# Patient Record
Sex: Female | Born: 1950
Health system: Southern US, Community
[De-identification: ages and names within clinical notes are randomized; demographics above are authoritative.]

## PROBLEM LIST (undated history)

## (undated) DIAGNOSIS — F419 Anxiety disorder, unspecified: Secondary | ICD-10-CM

## (undated) DIAGNOSIS — K219 Gastro-esophageal reflux disease without esophagitis: Secondary | ICD-10-CM

## (undated) DIAGNOSIS — T7840XA Allergy, unspecified, initial encounter: Secondary | ICD-10-CM

## (undated) DIAGNOSIS — M1990 Unspecified osteoarthritis, unspecified site: Secondary | ICD-10-CM

## (undated) HISTORY — DX: Anxiety disorder, unspecified: F41.9

## (undated) HISTORY — DX: Gastro-esophageal reflux disease without esophagitis: K21.9

## (undated) HISTORY — PX: UTERINE FIBROID SURGERY: SHX826

## (undated) HISTORY — PX: HERNIA REPAIR: SHX51

## (undated) HISTORY — DX: Unspecified osteoarthritis, unspecified site: M19.90

## (undated) HISTORY — DX: Allergy, unspecified, initial encounter: T78.40XA

## (undated) HISTORY — PX: ABDOMINAL HYSTERECTOMY: SHX81

---

## 2003-06-04 ENCOUNTER — Other Ambulatory Visit: Admission: RE | Admit: 2003-06-04 | Discharge: 2003-06-04 | Payer: Self-pay | Admitting: Internal Medicine

## 2004-04-12 ENCOUNTER — Ambulatory Visit: Payer: Self-pay | Admitting: Unknown Physician Specialty

## 2004-04-12 ENCOUNTER — Encounter: Payer: Self-pay | Admitting: Internal Medicine

## 2004-04-12 LAB — HM COLONOSCOPY

## 2005-08-08 ENCOUNTER — Ambulatory Visit: Payer: Self-pay | Admitting: Internal Medicine

## 2005-09-14 ENCOUNTER — Ambulatory Visit: Payer: Self-pay | Admitting: Internal Medicine

## 2005-09-14 ENCOUNTER — Other Ambulatory Visit: Admission: RE | Admit: 2005-09-14 | Discharge: 2005-09-14 | Payer: Self-pay | Admitting: Internal Medicine

## 2005-09-14 LAB — CONVERTED CEMR LAB: Pap Smear: NORMAL

## 2006-03-05 ENCOUNTER — Ambulatory Visit: Payer: Self-pay | Admitting: Internal Medicine

## 2006-03-12 ENCOUNTER — Encounter: Payer: Self-pay | Admitting: Internal Medicine

## 2006-08-08 ENCOUNTER — Encounter: Payer: Self-pay | Admitting: Internal Medicine

## 2007-05-21 ENCOUNTER — Ambulatory Visit: Payer: Self-pay | Admitting: Internal Medicine

## 2007-05-21 DIAGNOSIS — IMO0002 Reserved for concepts with insufficient information to code with codable children: Secondary | ICD-10-CM | POA: Insufficient documentation

## 2007-06-03 ENCOUNTER — Encounter: Payer: Self-pay | Admitting: Internal Medicine

## 2007-12-23 ENCOUNTER — Ambulatory Visit: Payer: Self-pay | Admitting: Internal Medicine

## 2007-12-23 DIAGNOSIS — R002 Palpitations: Secondary | ICD-10-CM

## 2008-08-16 ENCOUNTER — Telehealth: Payer: Self-pay | Admitting: Internal Medicine

## 2008-09-02 ENCOUNTER — Ambulatory Visit: Payer: Self-pay | Admitting: Family Medicine

## 2008-09-02 DIAGNOSIS — N644 Mastodynia: Secondary | ICD-10-CM

## 2008-09-02 DIAGNOSIS — M76899 Other specified enthesopathies of unspecified lower limb, excluding foot: Secondary | ICD-10-CM | POA: Insufficient documentation

## 2008-09-02 DIAGNOSIS — M25539 Pain in unspecified wrist: Secondary | ICD-10-CM | POA: Insufficient documentation

## 2008-09-13 ENCOUNTER — Encounter: Payer: Self-pay | Admitting: Family Medicine

## 2008-09-16 ENCOUNTER — Encounter (INDEPENDENT_AMBULATORY_CARE_PROVIDER_SITE_OTHER): Payer: Self-pay | Admitting: *Deleted

## 2009-10-17 ENCOUNTER — Ambulatory Visit: Payer: Self-pay | Admitting: Internal Medicine

## 2009-10-17 ENCOUNTER — Other Ambulatory Visit: Admission: RE | Admit: 2009-10-17 | Discharge: 2009-10-17 | Payer: Self-pay | Admitting: Internal Medicine

## 2009-10-17 DIAGNOSIS — D239 Other benign neoplasm of skin, unspecified: Secondary | ICD-10-CM | POA: Insufficient documentation

## 2009-10-17 DIAGNOSIS — R109 Unspecified abdominal pain: Secondary | ICD-10-CM | POA: Insufficient documentation

## 2009-10-17 LAB — HM PAP SMEAR

## 2009-10-19 ENCOUNTER — Encounter: Payer: Self-pay | Admitting: Internal Medicine

## 2009-10-19 LAB — CONVERTED CEMR LAB
ALT: 13 units/L (ref 0–35)
AST: 19 units/L (ref 0–37)
Albumin: 4.5 g/dL (ref 3.5–5.2)
Alkaline Phosphatase: 58 units/L (ref 39–117)
BUN: 15 mg/dL (ref 6–23)
CO2: 28 meq/L (ref 19–32)
Eosinophils Absolute: 0.1 10*3/uL (ref 0.0–0.7)
GFR calc non Af Amer: 90.86 mL/min (ref >60)
Glucose, Bld: 70 mg/dL (ref 70–99)
HCT: 39.3 % (ref 36.0–46.0)
Lymphocytes Relative: 30.4 % (ref 12.0–46.0)
Monocytes Absolute: 0.3 10*3/uL (ref 0.1–1.0)
Monocytes Relative: 5.5 % (ref 3.0–12.0)
Sodium: 146 meq/L — ABNORMAL HIGH (ref 135–145)
TSH: 1.71 microintl units/mL (ref 0.35–5.50)
Total Bilirubin: 1 mg/dL (ref 0.3–1.2)
Vitamin B-12: 212 pg/mL (ref 211–911)

## 2009-10-28 ENCOUNTER — Encounter: Payer: Self-pay | Admitting: Internal Medicine

## 2009-10-31 DIAGNOSIS — R928 Other abnormal and inconclusive findings on diagnostic imaging of breast: Secondary | ICD-10-CM | POA: Insufficient documentation

## 2009-11-01 ENCOUNTER — Telehealth: Payer: Self-pay | Admitting: Internal Medicine

## 2009-11-04 ENCOUNTER — Encounter: Admission: RE | Admit: 2009-11-04 | Discharge: 2009-11-04 | Payer: Self-pay | Admitting: Internal Medicine

## 2009-11-04 LAB — HM MAMMOGRAPHY

## 2009-11-22 ENCOUNTER — Ambulatory Visit: Payer: Self-pay | Admitting: Internal Medicine

## 2009-11-22 DIAGNOSIS — E559 Vitamin D deficiency, unspecified: Secondary | ICD-10-CM | POA: Insufficient documentation

## 2009-11-22 DIAGNOSIS — E538 Deficiency of other specified B group vitamins: Secondary | ICD-10-CM | POA: Insufficient documentation

## 2009-11-23 LAB — CONVERTED CEMR LAB
Vit D, 25-Hydroxy: 40 ng/mL (ref 30–89)
Vitamin B-12: 520 pg/mL (ref 211–911)

## 2010-06-04 LAB — CONVERTED CEMR LAB
BUN: 13 mg/dL (ref 6–23)
Basophils Absolute: 0 10*3/uL (ref 0.0–0.1)
CO2: 29 meq/L (ref 19–32)
Chloride: 105 meq/L (ref 96–112)
Eosinophils Absolute: 0.2 10*3/uL (ref 0.0–0.7)
Eosinophils Relative: 4.2 % (ref 0.0–5.0)
GFR calc Af Amer: 111 mL/min
GFR calc non Af Amer: 92 mL/min
Glucose, Bld: 80 mg/dL (ref 70–99)
LDL Cholesterol: 73 mg/dL (ref 0–99)
Neutrophils Relative %: 57.2 % (ref 43.0–77.0)
Phosphorus: 3.7 mg/dL (ref 2.3–4.6)
TSH: 1.2 microintl units/mL (ref 0.35–5.50)
Total CHOL/HDL Ratio: 3.2
Total Protein: 6.6 g/dL (ref 6.0–8.3)
Triglycerides: 52 mg/dL (ref 0–149)
VLDL: 10 mg/dL (ref 0–40)

## 2010-06-06 NOTE — Assessment & Plan Note (Signed)
Summary: CPX/CLE   Vital Signs:  Patient profile:   60 year old female Height:      66 inches Weight:      121 pounds BMI:     19.60 Temp:     98.3 degrees F oral Pulse rate:   60 / minute Pulse rhythm:   regular BP sitting:   118 / 78  (left arm) Cuff size:   regular  Vitals Entered By: Mervin Hack CMA Duncan Dull) (October 17, 2009 11:40 AM) CC: adult physical   History of Present Illness: Doing well  Has had a residual "motor running in my stomach" that goes back to hyper spells that woke her up years ago No recurrence of the spells but still abnormal sensation "trembling, almost like a cat purring"--- very intermittent. Occ affects her sleep No problems with appetite  no nausea or vomting  Also notes that she gets "electric type sensation" down both legs strange sensation without clear precipitating reasons lasts for a few seconds and then resolves  Still wtih abnormal sensation in left breast goes back a year or more not really painful Gets an "umph" if she burps --in the left breast area  Occ "spasm" when she swallows More with liquid Feels like it "catches"---no dysphagia otherwise just watches and it resolves     Allergies: No Known Drug Allergies  Past History:  Past Surgical History: Last updated: 06/03/2007 Vag delivery x 5 Fibroid procedure- D & C (2001) Hysterectomy (11/2003)  Family History: Last updated: 06/03/2007 Father: skin cancers, ?  melanoma Mother:  1 brother 1 sister died from colon cancer age 5 No CAD DM on mom's side No breast cancer  Social History: Last updated: 06/03/2007 Occupation: Builds houses Married--5 children  Never Smoked Alcohol use-no  Review of Systems General:  Denies sleep disorder; weight fairly stable exercises regularly wears seat belt. Eyes:  Denies double vision and vision loss-1 eye. ENT:  Denies decreased hearing and ringing in ears; teeth okay---regular with dentist. CV:  See HPI; Complains  of chest pain or discomfort; denies difficulty breathing at night, difficulty breathing while lying down, fainting, lightheadness, palpitations, and shortness of breath with exertion. Resp:  Denies cough and shortness of breath. GI:  Complains of change in bowel habits; denies abdominal pain, bloody stools, dark tarry stools, indigestion, loss of appetite, nausea, and vomiting; loose stools only if she is nervous. GU:  Denies dysuria and incontinence; occ vaginal irritation Husband interested in resuming sex life and she is worried about this will try lubricant--can consider topical estrogen as needed  No bleeding. MS:  Denies joint pain and joint swelling; has noted some nodules on DIP of 2nd and 3rd right fingers. Derm:  Complains of lesion(s); denies rash; moles on arms --she wants checked. Neuro:  See HPI; Complains of numbness; denies headaches and weakness. Psych:  Denies anxiety and depression. Heme:  Denies abnormal bruising and enlarge lymph nodes. Allergy:  Complains of seasonal allergies and sneezing; mild symptoms--no Rx.  Physical Exam  General:  alert and normal appearance.   Eyes:  pupils equal, pupils round, pupils reactive to light, and no injection.   Ears:  R ear normal and L ear normal.   Mouth:  no erythema, no exudates, and no lesions.   Neck:  supple, no masses, no thyromegaly, no carotid bruits, and no cervical lymphadenopathy.   Breasts:  no masses, no abnormal thickening, no nipple discharge, no tenderness, and no adenopathy.   Lungs:  normal respiratory effort and  normal breath sounds.   Heart:  normal rate, regular rhythm, no murmur, and no gallop.   Abdomen:  soft and non-tender.   Palpable pulsatile mass in midline Genitalia:  normal introitus, no external lesions, no vaginal or cervical lesions, and no friaility or hemorrhage.   Cervix normal --pap done mild vaginal atrophy and pain with exam Msk:  no joint tenderness and no joint swelling.   Pulses:   normal in feet Extremities:  no edema Neurologic:  alert & oriented X3, strength normal in all extremities, and gait normal.   Skin:  benign nevus on left forearm multilobed dermatofibroma on right arm Psych:  normally interactive, good eye contact, not anxious appearing, and not depressed appearing.     Impression & Recommendations:  Problem # 1:  PREVENTIVE HEALTH CARE (ICD-V70.0) Assessment Comment Only healthy in general very active due for mammo  Problem # 2:  ABDOMINAL PAIN (ICD-789.00) Assessment: New  actually poorly characterized sensation in abd and legs at times she relates these 2 abd aorta palpable will check labs and ultrasound  Orders: Venipuncture (16109) TLB-Renal Function Panel (80069-RENAL) TLB-CBC Platelet - w/Differential (85025-CBCD) TLB-Hepatic/Liver Function Pnl (80076-HEPATIC) TLB-TSH (Thyroid Stimulating Hormone) (84443-TSH) TLB-B12, Serum-Total ONLY (60454-U98) Radiology Referral (Radiology)  Problem # 3:  DERMATOFIBROMA (ICD-216.9) Assessment: New  treated with liquid nitrogen 40 seconds x 2 tolerated well  Orders: Cryotherapy/Destruction benign or premalignant lesion (1st lesion)  (17000)  Patient Instructions: 1)  Please schedule a follow-up appointment in 1 year.  2)  Schedule your mammogram.  3)  Please set up abd ultrasound   Prior Medications: None Current Allergies (reviewed today): No known allergies

## 2010-06-06 NOTE — Progress Notes (Signed)
  Phone Note Call from Patient   Caller: Patient Summary of Call: Spoke to the patient and she wants you tocheck her vitamin d level as it was low 3 years ago, also when you do the vitamin b-12 lab she read that you should also check the MMA. She is set up on 11/22/2009 to check her vitamin b-12. Initial call taken by: Carlton Adam,  November 01, 2009 10:17 AM  Follow-up for Phone Call        okay to add MMA and vitamin D I generally recommend just taking vitamin D supplements and not worrying about the level but I am okay with doing the lab  THe methylmalonic acid helps confirm B12 deficiency that requires injection therapy. I generally give trial of the oral for a month and recheck which makes the MMA less important. Okay to do though Follow-up by: Cindee Salt MD,  November 01, 2009 10:57 AM     Appended Document:  I told patient she didn't have a DX to support the tests she wanted for insurance payment. She is going to call insurance , I gave her CPT codes. Since her Vit B12 was borderline low, I told her thats why you put her on OTC suppliments, when we recheck her level she may or not  then have the DX to support the extra tests.Mills Koller  November 01, 2009 11:22 AM

## 2010-06-06 NOTE — Letter (Signed)
Summary: Results Follow up Letter  [facility]  [address] [place], [place] [address]   Phone: [phone]  Fax: [phone]    10/19/2009 MRN: [id number]  Catherine Strickland [address] [place], [place]  [address]  Dear [name],  The following are the results of your recent test(s):  Test         Result    Pap Smear:        Normal __X___  Not Normal _____ Comments:Pap normal ______________________________________________________ Cholesterol: LDL(Bad cholesterol):         Your goal is less than:         HDL (Good cholesterol):       Your goal is more than: Comments:  ______________________________________________________ Mammogram:        Normal _____  Not Normal _____ Comments:  ___________________________________________________________________ Hemoccult:        Normal _____  Not normal _______ Comments:    _____________________________________________________________________ Other Tests:    We routinely do not discuss normal results over the telephone.  If you desire a copy of the results, or you have any questions about this information we can discuss them at your next office visit.   Sincerely,     Tillman Abide, MD

## 2010-06-06 NOTE — Progress Notes (Signed)
Summary: regarding additional views  Phone Note Call from Patient Call back at Home Phone [phone]   Caller: Patient Call For: Cindee Salt MD Summary of Call: Pt needs additional mammogram views.  She went to [facility] for her mammogram but wants to go to the breast center in [place] for additional views.  She wants the appt available at 10:20 on 7/01, or something close to that. Initial call taken by: Lowella Petties CMA,  November 01, 2009 11:32 AM  Follow-up for Phone Call        okay to set up there if possible Follow-up by: Cindee Salt MD,  November 01, 2009 12:02 PM  Additional Follow-up for Phone Call Additional follow up Details #1::        Was able to schedule the appt at [facility] for Friday July 1st at 10:20am. Order was faxed along with report from screening mammogram from [facility]. Patient will pick up disc with screening mammogram on it and bring to [facility]. Additional Follow-up by: Carlton Adam,  November 01, 2009 12:42 PM

## 2011-09-11 ENCOUNTER — Ambulatory Visit (INDEPENDENT_AMBULATORY_CARE_PROVIDER_SITE_OTHER): Payer: BC Managed Care – PPO | Admitting: Internal Medicine

## 2011-09-11 ENCOUNTER — Encounter: Payer: Self-pay | Admitting: Internal Medicine

## 2011-09-11 VITALS — BP 138/80 | HR 82 | Temp 98.6°F | Wt 129.0 lb

## 2011-09-11 DIAGNOSIS — Z1231 Encounter for screening mammogram for malignant neoplasm of breast: Secondary | ICD-10-CM

## 2011-09-11 DIAGNOSIS — R079 Chest pain, unspecified: Secondary | ICD-10-CM | POA: Insufficient documentation

## 2011-09-11 DIAGNOSIS — R0789 Other chest pain: Secondary | ICD-10-CM

## 2011-09-11 DIAGNOSIS — E538 Deficiency of other specified B group vitamins: Secondary | ICD-10-CM

## 2011-09-11 DIAGNOSIS — N644 Mastodynia: Secondary | ICD-10-CM

## 2011-09-11 LAB — CBC WITH DIFFERENTIAL/PLATELET
Eosinophils Relative: 1.6 % (ref 0.0–5.0)
HCT: 40.2 % (ref 36.0–46.0)
MCV: 88.8 fl (ref 78.0–100.0)
Monocytes Absolute: 0.5 10*3/uL (ref 0.1–1.0)
Monocytes Relative: 6 % (ref 3.0–12.0)
RBC: 4.52 Mil/uL (ref 3.87–5.11)
RDW: 13.3 % (ref 11.5–14.6)
WBC: 7.9 10*3/uL (ref 4.5–10.5)

## 2011-09-11 LAB — BASIC METABOLIC PANEL
CO2: 28 mEq/L (ref 19–32)
Calcium: 9.5 mg/dL (ref 8.4–10.5)
Creatinine, Ser: 0.8 mg/dL (ref 0.4–1.2)
GFR: 82.11 mL/min (ref >60.00)
Glucose, Bld: 66 mg/dL — ABNORMAL LOW (ref 70–99)
Potassium: 4.4 mEq/L (ref 3.5–5.1)
Sodium: 142 mEq/L (ref 135–145)

## 2011-09-11 LAB — HEPATIC FUNCTION PANEL: AST: 20 U/L (ref 0–37)

## 2011-09-11 NOTE — Assessment & Plan Note (Signed)
Has been on oral therapy but stopped a month ago Even if normal, probably needs to continue

## 2011-09-11 NOTE — Assessment & Plan Note (Signed)
Very atypical No exertional symptoms EKG is normal except for 1 PAC No action needed

## 2011-09-11 NOTE — Assessment & Plan Note (Signed)
Has had some pain but no findings on exam Will just set up mammogram

## 2011-09-11 NOTE — Progress Notes (Signed)
  Subjective:    Patient ID: Catherine Strickland, female    DOB: 1950-11-19, 61 y.o.   MRN: [id number]  HPI It is time for a mammogram Has had some left breast discomfort Mostly underneath--not really painful No breast discharge  Also notes some sharp pains in left chest if she takes deep breath in ?injury while working outside  Has had sinus symptoms in past couple of days Has some hoarseness also Some drainage which is making her throat somewhat sore Some ear pressure No headache No fever  No SOB No meds for this  Has been on vitamin B12 but stopped about a month ago No longer has the neurologic "electrical feeling" down her legs  No current outpatient prescriptions on file prior to visit.    No Known Allergies  No past medical history on file.  Past Surgical History  Procedure Date  . Vaginal delivery     x5  . Abdominal hysterectomy   . Uterine fibroid surgery     Family History  Problem Relation Age of Onset  . Heart disease Neg Hx     History   Social History  . Marital Status: Married    Spouse Name: N/A    Number of Children: 5  . Years of Education: N/A   Occupational History  . builds houses    Social History Main Topics  . Smoking status: Never Smoker   . Smokeless tobacco: Never Used  . Alcohol Use: No  . Drug Use: No  . Sexually Active: Not on file   Other Topics Concern  . Not on file   Social History Narrative  . No narrative on file   Review of Systems No environmental allergies No rash No vomiting or diarrhea Has ongoing, low grade resting tremor---most of the time but no always ("inside my body--not outside")    Objective:   Physical Exam  Constitutional: She appears well-developed and well-nourished. No distress.  HENT:       ?slight pharyngeal injection Mild nasal inflammation  Neck: Normal range of motion. Neck supple. No thyromegaly present.  Cardiovascular: Normal rate, regular rhythm, normal heart sounds and intact  distal pulses.  Exam reveals no gallop.   No murmur heard. Pulmonary/Chest: Effort normal and breath sounds normal. No respiratory distress. She has no wheezes. She has no rales. She exhibits no tenderness.  Abdominal: Soft. There is no tenderness.  Genitourinary:       No breast masses or tenderness  Musculoskeletal: She exhibits no edema and no tenderness.  Lymphadenopathy:    She has no cervical adenopathy.  Neurological:       Np resting tremor  Psychiatric: She has a normal mood and affect. Her behavior is normal.          Assessment & Plan:

## 2011-09-13 ENCOUNTER — Encounter: Payer: Self-pay | Admitting: *Deleted

## 2011-10-12 ENCOUNTER — Ambulatory Visit: Admission: RE | Admit: 2011-10-12 | Payer: BC Managed Care – PPO | Source: Ambulatory Visit

## 2011-10-12 ENCOUNTER — Other Ambulatory Visit: Payer: Self-pay | Admitting: Internal Medicine

## 2011-10-12 ENCOUNTER — Ambulatory Visit
Admission: RE | Admit: 2011-10-12 | Discharge: 2011-10-12 | Disposition: A | Discharge status: Home / Self Care | Payer: BC Managed Care – PPO | Source: Ambulatory Visit | Attending: Internal Medicine | Admitting: Internal Medicine

## 2011-10-12 DIAGNOSIS — Z1231 Encounter for screening mammogram for malignant neoplasm of breast: Secondary | ICD-10-CM

## 2011-10-23 ENCOUNTER — Encounter: Payer: Self-pay | Admitting: *Deleted

## 2012-05-09 ENCOUNTER — Encounter: Payer: Self-pay | Admitting: Internal Medicine

## 2012-09-16 ENCOUNTER — Ambulatory Visit (INDEPENDENT_AMBULATORY_CARE_PROVIDER_SITE_OTHER): Payer: BC Managed Care – PPO | Admitting: Internal Medicine

## 2012-09-16 ENCOUNTER — Encounter: Payer: Self-pay | Admitting: Internal Medicine

## 2012-09-16 VITALS — BP 128/88 | HR 57 | Temp 98.2°F | Ht 67.0 in | Wt 134.0 lb

## 2012-09-16 DIAGNOSIS — R002 Palpitations: Secondary | ICD-10-CM

## 2012-09-16 DIAGNOSIS — E538 Deficiency of other specified B group vitamins: Secondary | ICD-10-CM

## 2012-09-16 DIAGNOSIS — Z0000 Encounter for general adult medical examination without abnormal findings: Secondary | ICD-10-CM

## 2012-09-16 DIAGNOSIS — Z800 Family history of malignant neoplasm of digestive organs: Secondary | ICD-10-CM

## 2012-09-16 LAB — BASIC METABOLIC PANEL
BUN: 13 mg/dL (ref 6–23)
Calcium: 9.5 mg/dL (ref 8.4–10.5)
Chloride: 107 mEq/L (ref 96–112)
Glucose, Bld: 81 mg/dL (ref 70–99)
Potassium: 4.6 mEq/L (ref 3.5–5.1)

## 2012-09-16 LAB — HEPATIC FUNCTION PANEL
Alkaline Phosphatase: 55 U/L (ref 39–117)
Bilirubin, Direct: 0.1 mg/dL (ref 0.0–0.3)
Total Bilirubin: 0.7 mg/dL (ref 0.3–1.2)
Total Protein: 7.2 g/dL (ref 6.0–8.3)

## 2012-09-16 LAB — LIPID PANEL
HDL: 46.1 mg/dL (ref >39.00)
Triglycerides: 65 mg/dL (ref 0.0–149.0)

## 2012-09-16 LAB — VITAMIN B12: Vitamin B-12: 471 pg/mL (ref 211–911)

## 2012-09-16 LAB — TSH: TSH: 1.53 u[IU]/mL (ref 0.35–5.50)

## 2012-09-16 NOTE — Assessment & Plan Note (Signed)
Still gets abnormal brief sensation Will just check labs

## 2012-09-16 NOTE — Assessment & Plan Note (Signed)
Healthy Preferred to defer the Pap mammo next year

## 2012-09-16 NOTE — Patient Instructions (Signed)
Please try omeprazole 20mg  (over the counter) daily on an empty stomach for about 2 weeks to see if that gets rid of the throat feeling.

## 2012-09-16 NOTE — Progress Notes (Signed)
Subjective:    Patient ID: Catherine Strickland, female    DOB: 28-Jun-1950, 62 y.o.   MRN: [id number]  HPI Here for physical Pap 3 years ago---prefers to wait on repeat Colonoscopy in 2005---not sure if she had a polyp. Some concern since sister had colon cancer Reviewed report---was normal but he recommended 3 year follow up  Has been getting slight feeling in throat for a couple of weeks ?heartburn Did get water brash several months ago--this is uncommon No swallowing problems  Tries to walk a little  No current outpatient prescriptions on file prior to visit.   No current facility-administered medications on file prior to visit.    No Known Allergies  No past medical history on file.  Past Surgical History  Procedure Laterality Date  . Vaginal delivery      x5  . Abdominal hysterectomy    . Uterine fibroid surgery      Family History  Problem Relation Age of Onset  . Heart disease Neg Hx   . COPD Father   . Cancer Father     lung  . Cancer Mother     lung cancer    History   Social History  . Marital Status: Married    Spouse Name: N/A    Number of Children: 5  . Years of Education: N/A   Occupational History  . 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 Musician     church   Social History Main Topics  . Smoking status: Never Smoker   . Smokeless tobacco: Never Used  . Alcohol Use: No  . Drug Use: No  . Sexually Active: Not on file   Other Topics Concern  . Not on file   Social History Narrative  . No narrative on file   Review of Systems  Constitutional: Negative for fatigue and unexpected weight change.       Wears seat belt  HENT: Negative for hearing loss, congestion, rhinorrhea, dental problem and tinnitus.        Regular with dentist  Eyes: Negative for visual disturbance.       No diplopia or unilateral vision loss  Respiratory: Negative for cough, chest tightness and shortness of breath.   Cardiovascular: Positive for palpitations.  Negative for chest pain and leg swelling.       Still gets sensation "like my body stops for a fraction of a second"  Gastrointestinal: Negative for nausea, vomiting, abdominal pain, constipation and blood in stool.  Endocrine: Negative for cold intolerance and heat intolerance.  Genitourinary: Negative for dysuria, difficulty urinating and dyspareunia.  Musculoskeletal: Negative for back pain, joint swelling and arthralgias.       Has some DIP nodules but no pain  Skin: Negative for rash.       No suspicious lesions  Allergic/Immunologic: Negative for environmental allergies and immunocompromised state.  Neurological: Positive for tremors. Negative for dizziness, syncope, weakness, light-headedness, numbness and headaches.       Gets very slight tremor "inside". Takes B12 and it may help  Hematological: Negative for adenopathy. Does not bruise/bleed easily.  Psychiatric/Behavioral: Negative for sleep disturbance and dysphoric mood. The patient is not nervous/anxious.        Objective:   Physical Exam  Constitutional: She is oriented to person, place, and time. She appears well-developed and well-nourished. No distress.  HENT:  Head: Normocephalic and atraumatic.  Right Ear: External ear normal.  Left Ear: External ear normal.  Mouth/Throat: Oropharynx is clear  and moist. No oropharyngeal exudate.  Eyes: Conjunctivae and EOM are normal. Pupils are equal, round, and reactive to light.  Neck: Normal range of motion. Neck supple. No thyromegaly present.  Cardiovascular: Normal rate, regular rhythm, normal heart sounds and intact distal pulses.  Exam reveals no gallop.   No murmur heard. Pulmonary/Chest: Effort normal and breath sounds normal. No respiratory distress. She has no wheezes. She has no rales.  Abdominal: Soft. There is no tenderness.  Genitourinary:  No breast masses  Musculoskeletal: She exhibits no edema and no tenderness.  Lymphadenopathy:    She has no cervical  adenopathy.    She has no axillary adenopathy.  Neurological: She is alert and oriented to person, place, and time.  Skin: No rash noted. No erythema.  Psychiatric: She has a normal mood and affect. Her behavior is normal.          Assessment & Plan:

## 2012-09-17 LAB — CBC WITH DIFFERENTIAL/PLATELET
Basophils Absolute: 0 10*3/uL (ref 0.0–0.1)
Basophils Relative: 0.3 % (ref 0.0–3.0)
Eosinophils Absolute: 0.1 10*3/uL (ref 0.0–0.7)
Eosinophils Relative: 2.7 % (ref 0.0–5.0)
HCT: 39.4 % (ref 36.0–46.0)
Hemoglobin: 13.7 g/dL (ref 12.0–15.0)
Lymphocytes Relative: 29.3 % (ref 12.0–46.0)
Lymphs Abs: 1.6 10*3/uL (ref 0.7–4.0)
MCV: 86.6 fl (ref 78.0–100.0)
Monocytes Relative: 7.2 % (ref 3.0–12.0)
Platelets: 188 10*3/uL (ref 150.0–400.0)
RDW: 13.1 % (ref 11.5–14.6)
WBC: 5.3 10*3/uL (ref 4.5–10.5)

## 2012-09-17 LAB — VITAMIN D 25 HYDROXY (VIT D DEFICIENCY, FRACTURES): Vit D, 25-Hydroxy: 29 ng/mL — ABNORMAL LOW (ref 30–89)

## 2012-09-17 NOTE — Addendum Note (Signed)
Addended by: Alvina Chou on: 09/17/2012 11:35 AM   Modules accepted: Orders

## 2013-02-19 ENCOUNTER — Encounter: Payer: Self-pay | Admitting: Internal Medicine

## 2013-03-12 ENCOUNTER — Other Ambulatory Visit: Payer: Self-pay

## 2013-05-19 LAB — HM MAMMOGRAPHY

## 2013-05-22 ENCOUNTER — Encounter: Payer: Self-pay | Admitting: Internal Medicine

## 2013-07-28 ENCOUNTER — Encounter: Payer: Self-pay | Admitting: Internal Medicine

## 2013-09-22 ENCOUNTER — Ambulatory Visit (INDEPENDENT_AMBULATORY_CARE_PROVIDER_SITE_OTHER): Payer: No Typology Code available for payment source | Admitting: Internal Medicine

## 2013-09-22 ENCOUNTER — Encounter: Payer: Self-pay | Admitting: Internal Medicine

## 2013-09-22 VITALS — BP 110/70 | HR 80 | Temp 98.4°F | Ht 67.0 in | Wt 135.0 lb

## 2013-09-22 DIAGNOSIS — E538 Deficiency of other specified B group vitamins: Secondary | ICD-10-CM

## 2013-09-22 DIAGNOSIS — R002 Palpitations: Secondary | ICD-10-CM

## 2013-09-22 DIAGNOSIS — Z0000 Encounter for general adult medical examination without abnormal findings: Secondary | ICD-10-CM

## 2013-09-22 LAB — COMPREHENSIVE METABOLIC PANEL
ALT: 26 U/L (ref 0–35)
AST: 24 U/L (ref 0–37)
Albumin: 4.5 g/dL (ref 3.5–5.2)
Alkaline Phosphatase: 58 U/L (ref 39–117)
BILIRUBIN TOTAL: 0.4 mg/dL (ref 0.2–1.2)
BUN: 15 mg/dL (ref 6–23)
CO2: 27 mEq/L (ref 19–32)
Calcium: 9.4 mg/dL (ref 8.4–10.5)
Chloride: 105 mEq/L (ref 96–112)
Creatinine, Ser: 0.8 mg/dL (ref 0.4–1.2)
GFR: 72.67 mL/min (ref >60.00)
Glucose, Bld: 89 mg/dL (ref 70–99)
Potassium: 4.1 mEq/L (ref 3.5–5.1)
SODIUM: 140 meq/L (ref 135–145)
Total Protein: 7.3 g/dL (ref 6.0–8.3)

## 2013-09-22 LAB — LIPID PANEL
Cholesterol: 146 mg/dL (ref 0–200)
HDL: 52 mg/dL (ref >39.00)
LDL CALC: 82 mg/dL (ref 0–99)
Total CHOL/HDL Ratio: 3
Triglycerides: 62 mg/dL (ref 0.0–149.0)
VLDL: 12.4 mg/dL (ref 0.0–40.0)

## 2013-09-22 LAB — CBC WITH DIFFERENTIAL/PLATELET
Basophils Absolute: 0 10*3/uL (ref 0.0–0.1)
Basophils Relative: 0.6 % (ref 0.0–3.0)
EOS ABS: 0.2 10*3/uL (ref 0.0–0.7)
EOS PCT: 3.8 % (ref 0.0–5.0)
HEMATOCRIT: 41.1 % (ref 36.0–46.0)
Hemoglobin: 14 g/dL (ref 12.0–15.0)
LYMPHS ABS: 1.4 10*3/uL (ref 0.7–4.0)
Lymphocytes Relative: 28.1 % (ref 12.0–46.0)
MCHC: 34 g/dL (ref 30.0–36.0)
MCV: 87.9 fl (ref 78.0–100.0)
MONO ABS: 0.3 10*3/uL (ref 0.1–1.0)
Monocytes Relative: 6.4 % (ref 3.0–12.0)
Neutro Abs: 3.1 10*3/uL (ref 1.4–7.7)
Neutrophils Relative %: 61.1 % (ref 43.0–77.0)
PLATELETS: 215 10*3/uL (ref 150.0–400.0)
RBC: 4.68 Mil/uL (ref 3.87–5.11)
RDW: 13.4 % (ref 11.5–15.5)
WBC: 5 10*3/uL (ref 4.0–10.5)

## 2013-09-22 LAB — TSH: TSH: 1.09 u[IU]/mL (ref 0.35–4.50)

## 2013-09-22 LAB — T4, FREE: Free T4: 0.83 ng/dL (ref 0.60–1.60)

## 2013-09-22 LAB — VITAMIN B12: Vitamin B-12: 637 pg/mL (ref 211–911)

## 2013-09-22 NOTE — Progress Notes (Signed)
Pre visit review using our clinic review tool, if applicable. No additional management support is needed unless otherwise documented below in the visit note. 

## 2013-09-22 NOTE — Assessment & Plan Note (Signed)
Only gets funny feeling if she misses B12 for a while

## 2013-09-22 NOTE — Assessment & Plan Note (Signed)
Healthy Had supracervical hysterectomy but still prefers no pap. Will review next year mammo every 2 years Just had colonoscopy

## 2013-09-22 NOTE — Progress Notes (Signed)
Subjective:    Patient ID: Catherine Strickland, female    DOB: [birth year], 63 y.o.   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   No current facility-administered medications on file prior to visit.    No Known Allergies  No past medical history on file.  Past Surgical History  Procedure Laterality Date  . Vaginal delivery      x5  . Abdominal hysterectomy    . Uterine fibroid surgery      Family History  Problem Relation Age of Onset  . Heart disease Neg Hx   . COPD Father   . Cancer Father     lung  . Cancer Mother     lung cancer    History   Social History  . Marital Status: Married    Spouse Name: N/A    Number of Children: 42  . Years of Education: N/A   Occupational History  . 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 Smoking status: Never Smoker   . Smokeless tobacco: Never Used  . Alcohol Use: No  . Drug Use: No  . Sexual Activity: Not on file   Other Topics Concern  . Not on file   Social History Narrative  . No narrative on file   Review of Systems  Constitutional: Negative for fatigue and unexpected weight change.       Wears seat belt  HENT: Negative for dental problem, hearing loss and tinnitus.        Regular with dentist  Eyes: Negative for visual disturbance.       No diplopia or unilateral vision loss  Respiratory: Negative for cough, chest tightness and shortness of breath.   Cardiovascular: Negative for chest pain, palpitations and leg swelling.       Feels trembly if misses B12  Gastrointestinal: Negative for nausea, vomiting, abdominal pain, constipation and blood in stool.       Rare heartburn-- uses baking soda  Endocrine: Negative for cold  intolerance and heat intolerance.  Genitourinary: Positive for difficulty urinating. Negative for dysuria, hematuria and dyspareunia.       Has occasional dribbling  Musculoskeletal: Negative for arthralgias, back pain and joint swelling.  Skin: Negative for rash.       No suspicious lesions  Allergic/Immunologic: Positive for environmental allergies. Negative for immunocompromised state.       Mild congestion ---no meds  Neurological: Negative for dizziness, syncope, weakness, light-headedness, numbness and headaches.  Hematological: Negative for adenopathy. Does not bruise/bleed easily.  Psychiatric/Behavioral: Negative for sleep disturbance and dysphoric mood. The patient is not nervous/anxious.        Objective:   Physical Exam  Constitutional: She is oriented to person, place, and time. She appears well-developed and well-nourished. No distress.  HENT:  Head: Normocephalic and atraumatic.  Right Ear: External ear normal.  Left Ear: External ear normal.  Mouth/Throat: Oropharynx is clear and moist. No oropharyngeal exudate.  Eyes: Conjunctivae and EOM are normal. Pupils are equal, round, and reactive to light.  Neck: Normal range of motion. Neck supple. No thyromegaly present.  Cardiovascular: Normal rate, regular rhythm, normal heart sounds and intact distal pulses.  Exam reveals no gallop.   No murmur heard. Pulmonary/Chest: Effort normal and breath sounds normal. No respiratory distress. She has no wheezes. She has no rales.  Abdominal: Soft. There is no tenderness.  Genitourinary:  No breast masses or tenderness  Musculoskeletal: She exhibits no edema and no tenderness.  Lymphadenopathy:    She has no cervical adenopathy.    She has no axillary adenopathy.  Neurological: She is alert and oriented to person, place, and time.  Skin: No rash noted. No erythema.  Psychiatric: She has a normal mood and affect. Her behavior is normal.          Assessment & Plan:

## 2013-09-22 NOTE — Patient Instructions (Signed)
Please start a daily vitamin D tablet as well as the B12

## 2013-09-22 NOTE — Assessment & Plan Note (Signed)
No sensory problems while taking the supplement

## 2013-12-24 ENCOUNTER — Telehealth: Payer: Self-pay | Admitting: Internal Medicine

## 2013-12-24 NOTE — Telephone Encounter (Signed)
Pt called very upset about her bill.  She got a bill from [facility] since her insurance requires that all labs go to [facility].  She did not tell us that the labs would need to go to [facility]. She demands that this is our fault and we should have known.  She states that it was our responsibility when we took her new insurance card to know that she needed to use [facility].

## 2015-02-02 ENCOUNTER — Encounter: Payer: Self-pay | Admitting: Physician Assistant

## 2015-02-02 ENCOUNTER — Ambulatory Visit: Payer: Self-pay | Admitting: Physician Assistant

## 2015-02-02 VITALS — BP 130/79 | HR 74 | Temp 98.8°F

## 2015-02-02 DIAGNOSIS — B379 Candidiasis, unspecified: Secondary | ICD-10-CM

## 2015-02-02 DIAGNOSIS — L237 Allergic contact dermatitis due to plants, except food: Secondary | ICD-10-CM

## 2015-02-02 DIAGNOSIS — L739 Follicular disorder, unspecified: Secondary | ICD-10-CM

## 2015-02-02 MED ORDER — DEXAMETHASONE SODIUM PHOSPHATE 10 MG/ML IJ SOLN: 10.0000 mg | Freq: Once | INTRAMUSCULAR | Status: DC

## 2015-02-02 MED ORDER — CEPHALEXIN 500 MG PO CAPS: 500.0000 mg | ORAL_CAPSULE | Freq: Three times a day (TID) | ORAL | Status: DC

## 2015-02-02 MED ORDER — CLOTRIMAZOLE-BETAMETHASONE 1-0.05 % EX CREA: 1.0000 "application " | TOPICAL_CREAM | Freq: Two times a day (BID) | CUTANEOUS | Status: DC

## 2015-02-02 NOTE — Addendum Note (Signed)
Addended by: Versie Starks on: 02/02/2015 11:08 AM   Modules accepted: Orders

## 2015-02-02 NOTE — Addendum Note (Signed)
Addended by: Versie Starks on: 02/02/2015 03:45 PM   Modules accepted: Orders

## 2015-02-02 NOTE — Progress Notes (Signed)
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 and wearing a hat so thought it was a heat rash, using hydrocortisone cream for itching which helps, now has a different rash on area under r arm and at left side of neck, thinks this may be poison ivy, pt denies fever, chills, remainder ros neg  O:  Vitals wnl, nad, lungs c t a, cv rrr, skin with pink raised areas between thighs, area under arm and side of neck is streaky like contact dermatitis, area at back of neck is red with several small bumps typical of folliculitis, no drainage or pustules noted, n/v intact  A: contact dermatitis, poison ivy; folliculitis, ?candidiasis  P:  Decadron 10mg  IM, keflex 500mg  tid if area at neck is worsening, tea tree oil in shampoo to help with itching, lotrisone cream for area on legs, return if worsening or not better in 5 t 7 days

## 2015-02-07 ENCOUNTER — Ambulatory Visit: Payer: Self-pay | Admitting: Family

## 2015-02-07 VITALS — BP 122/70 | HR 71 | Temp 97.7°F

## 2015-02-07 DIAGNOSIS — R21 Rash and other nonspecific skin eruption: Secondary | ICD-10-CM

## 2015-02-07 MED ORDER — PREDNISONE 10 MG (48) PO TBPK: ORAL_TABLET | Freq: Every day | ORAL | Status: DC

## 2015-02-07 NOTE — Progress Notes (Signed)
S/ follow up sxs and rash was better after decadron , but have reocurred, itching severe Did NOT start lotrisone , taking keflex  O/ VSS pleasant ,  Mid Neck hairline  area , macular , tan and red areas with  Some with borders no pustules  trunk arms, legs with red maculopapular lesions, variable sizes and crops prominent under left breast and inner thighs A/ Rash  - fungal, bacterial, overlying contact derm P / Sterapred pack  X 12 days. Advised to use lotrisome to neck , under breast and inner thighs  CUT NAILS

## 2015-02-16 ENCOUNTER — Ambulatory Visit: Payer: Self-pay | Admitting: Physician Assistant

## 2015-02-16 ENCOUNTER — Encounter: Payer: Self-pay | Admitting: Physician Assistant

## 2015-02-16 VITALS — BP 120/80 | HR 67 | Temp 97.8°F

## 2015-02-16 DIAGNOSIS — L309 Dermatitis, unspecified: Secondary | ICD-10-CM

## 2015-02-16 NOTE — Progress Notes (Signed)
S/ follow up  Generalized rash. Not improving at end of antbx, pred taper.  And topicals No fever. C/o stress .  O/ alert pleasant  VSS  NAD variable , red papules extremities, trunk, some drying in  Without secondary infection  A/ Dermatitis P / Refer to Derm . appt tomorrow .

## 2015-11-22 DIAGNOSIS — M5412 Radiculopathy, cervical region: Secondary | ICD-10-CM | POA: Diagnosis not present

## 2015-11-22 DIAGNOSIS — M9901 Segmental and somatic dysfunction of cervical region: Secondary | ICD-10-CM | POA: Diagnosis not present

## 2015-11-22 DIAGNOSIS — M5134 Other intervertebral disc degeneration, thoracic region: Secondary | ICD-10-CM | POA: Diagnosis not present

## 2015-11-22 DIAGNOSIS — M9902 Segmental and somatic dysfunction of thoracic region: Secondary | ICD-10-CM | POA: Diagnosis not present

## 2015-11-24 DIAGNOSIS — M9901 Segmental and somatic dysfunction of cervical region: Secondary | ICD-10-CM | POA: Diagnosis not present

## 2015-11-24 DIAGNOSIS — M5412 Radiculopathy, cervical region: Secondary | ICD-10-CM | POA: Diagnosis not present

## 2015-11-24 DIAGNOSIS — M5134 Other intervertebral disc degeneration, thoracic region: Secondary | ICD-10-CM | POA: Diagnosis not present

## 2015-11-24 DIAGNOSIS — M9902 Segmental and somatic dysfunction of thoracic region: Secondary | ICD-10-CM | POA: Diagnosis not present

## 2015-11-29 DIAGNOSIS — M9902 Segmental and somatic dysfunction of thoracic region: Secondary | ICD-10-CM | POA: Diagnosis not present

## 2015-11-29 DIAGNOSIS — M5412 Radiculopathy, cervical region: Secondary | ICD-10-CM | POA: Diagnosis not present

## 2015-11-29 DIAGNOSIS — M9901 Segmental and somatic dysfunction of cervical region: Secondary | ICD-10-CM | POA: Diagnosis not present

## 2015-11-29 DIAGNOSIS — M5134 Other intervertebral disc degeneration, thoracic region: Secondary | ICD-10-CM | POA: Diagnosis not present

## 2015-12-01 DIAGNOSIS — M5134 Other intervertebral disc degeneration, thoracic region: Secondary | ICD-10-CM | POA: Diagnosis not present

## 2015-12-01 DIAGNOSIS — M9902 Segmental and somatic dysfunction of thoracic region: Secondary | ICD-10-CM | POA: Diagnosis not present

## 2015-12-01 DIAGNOSIS — M5412 Radiculopathy, cervical region: Secondary | ICD-10-CM | POA: Diagnosis not present

## 2015-12-01 DIAGNOSIS — M9901 Segmental and somatic dysfunction of cervical region: Secondary | ICD-10-CM | POA: Diagnosis not present

## 2016-05-22 ENCOUNTER — Encounter: Payer: Self-pay | Admitting: Internal Medicine

## 2016-05-22 ENCOUNTER — Other Ambulatory Visit (HOSPITAL_COMMUNITY)
Admission: RE | Admit: 2016-05-22 | Discharge: 2016-05-22 | Disposition: A | Discharge status: Home / Self Care | Payer: PPO | Source: Ambulatory Visit | Attending: Internal Medicine | Admitting: Internal Medicine

## 2016-05-22 ENCOUNTER — Ambulatory Visit (INDEPENDENT_AMBULATORY_CARE_PROVIDER_SITE_OTHER): Payer: PPO | Admitting: Internal Medicine

## 2016-05-22 VITALS — BP 138/88 | HR 73 | Temp 98.2°F | Ht 66.0 in | Wt 139.0 lb

## 2016-05-22 DIAGNOSIS — Z1151 Encounter for screening for human papillomavirus (HPV): Secondary | ICD-10-CM | POA: Insufficient documentation

## 2016-05-22 DIAGNOSIS — E538 Deficiency of other specified B group vitamins: Secondary | ICD-10-CM | POA: Diagnosis not present

## 2016-05-22 DIAGNOSIS — R002 Palpitations: Secondary | ICD-10-CM | POA: Diagnosis not present

## 2016-05-22 DIAGNOSIS — Z01419 Encounter for gynecological examination (general) (routine) without abnormal findings: Secondary | ICD-10-CM | POA: Insufficient documentation

## 2016-05-22 DIAGNOSIS — Z0000 Encounter for general adult medical examination without abnormal findings: Secondary | ICD-10-CM

## 2016-05-22 DIAGNOSIS — Z23 Encounter for immunization: Secondary | ICD-10-CM

## 2016-05-22 DIAGNOSIS — Z780 Asymptomatic menopausal state: Secondary | ICD-10-CM

## 2016-05-22 MED ORDER — ZOSTER VAC RECOMB ADJUVANTED 50 MCG/0.5ML IM SUSR: 50.0000 ug | Freq: Once | INTRAMUSCULAR | 0 refills | Status: AC

## 2016-05-22 NOTE — Patient Instructions (Addendum)
Consider trying over the counter capsaicin cream for your thumb.  Please set up your screening mammogram.  Ask about the shingrix (shingles) vaccine at [facility] in the next few months (not out yet)

## 2016-05-22 NOTE — Progress Notes (Signed)
Subjective:    Patient ID: Catherine Strickland, female    DOB: January 09, 1951, 66 y.o.   MRN: [id number]  HPI Here for first Medicare wellness and follow up of chronic medical issues Reviewed form and  advanced directives Reviewed other doctors--chiropractor and eye doctor No alcohol or tobacco No set exercise but very physically active--discussed weights, etc Vision is okay. 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 vitamin B-12 (CYANOCOBALAMIN) 1000 MCG tablet Take 1,000 mcg by mouth daily.    . cholecalciferol (VITAMIN D) 1000 UNITS tablet Take 1,000 Units by mouth daily.     No current facility-administered medications on file prior to visit.     No Known Allergies  No past medical history on file.  Past Surgical History:  Procedure Laterality Date  . ABDOMINAL HYSTERECTOMY    . UTERINE FIBROID SURGERY    . VAGINAL DELIVERY     x5    Family History  Problem Relation Age of Onset  . COPD Father   . Cancer  Father     lung  . Cancer Mother     lung cancer  . Heart disease Neg Hx     Social History   Social History  . Marital status: Married    Spouse name: N/A  . Number of children: 5  . Years of education: N/A   Occupational History  . 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 Smoking status: Never Smoker  . Smokeless tobacco: Never Used  . Alcohol use No  . Drug use: No  . Sexual activity: Not on file   Other Topics Concern  . Not on file   Social History Narrative   Has living will   Husband is health care POA   Would accept resuscitation   No tube feeds if cognitively unaware   Review of Systems Feet get hot sometimes--- sporadic. No foot pain Appetite is fine Weight is up slightly Sleeps great Bowels are fine. No blood Has more urinary urgency lately--no incontinence. No hematuria No sex-- no problem No rash or suspicious skin lesions. Does get some itching at times on her forearms Did have episode of hives--wound up at dermatologist (?from stress) Keeps up with dentist-- no problems with teeth Hoyt Koch) Wears seat belt Has had some heartburn-- depending on what she eats. No dysphagia    Objective:   Physical Exam  Constitutional: She is oriented to person, place, and time. She appears well-developed and well-nourished. No distress.  HENT:  Mouth/Throat: Oropharynx is clear and moist. No oropharyngeal exudate.  Neck: Normal range of motion. Neck supple. No thyromegaly present.  Cardiovascular: Normal rate, regular rhythm, normal heart sounds and intact distal pulses.  Exam reveals no gallop.   No murmur heard. Pulmonary/Chest: Effort normal and breath sounds normal. No respiratory distress. She has no wheezes. She has no rales.  Abdominal: Soft. There is no tenderness.  Genitourinary:  Genitourinary Comments: Cervix appears normal--though hard to visualize as seems posteriorly displaced Pap done    Musculoskeletal: She exhibits no edema or tenderness.  Tenderness at right [facility] Sig crepitus at left shoulder--but fairly normal ROM  Lymphadenopathy:    She has no cervical adenopathy.  Neurological: She is alert and oriented to person, place, and time.  President--- "Dwaine Deter, Bush" [phone] D-l-r-o-w Recall 3/3  Skin: No rash noted. No erythema.  Psychiatric: She has a normal mood and affect. Her behavior is normal.          Assessment & Plan:

## 2016-05-22 NOTE — Assessment & Plan Note (Signed)
Discussed switching to sublingual

## 2016-05-22 NOTE — Addendum Note (Signed)
Addended by: Pilar Grammes on: 05/22/2016 01:15 PM   Modules accepted: Orders

## 2016-05-22 NOTE — Assessment & Plan Note (Signed)
Long standing and better overall Will just check labs

## 2016-05-22 NOTE — Addendum Note (Signed)
Addended by: Marchia Bond on: 05/22/2016 01:47 PM   Modules accepted: Orders

## 2016-05-22 NOTE — Progress Notes (Signed)
Pre visit review using our clinic review tool, if applicable. No additional management support is needed unless otherwise documented below in the visit note. 

## 2016-05-22 NOTE — Assessment & Plan Note (Signed)
I have personally reviewed the Medicare Annual Wellness questionnaire and have noted 1. The patient's medical and social history 2. Their use of alcohol, tobacco or illicit drugs 3. Their current medications and supplements 4. The patient's functional ability including ADL's, fall risks, home safety risks and hearing or visual             impairment. 5. Diet and physical activities 6. Evidence for depression or mood disorders  The patients weight, height, BMI and visual acuity have been recorded in the chart I have made referrals, counseling and provided education to the patient based review of the above and I have provided the pt with a written personalized care plan for preventive services.  I have provided you with a copy of your personalized plan for preventive services. Please take the time to review along with your updated medication list.  Will do prevnar--she prefers no flu vaccine Due for mammo--she will set up Colon due 2019 Last pap today Discussed exercise/weight training

## 2016-05-23 ENCOUNTER — Encounter: Payer: Self-pay | Admitting: Internal Medicine

## 2016-05-23 LAB — CBC WITH DIFFERENTIAL/PLATELET
BASOS: 0 %
Basophils Absolute: 0 10*3/uL (ref 0.0–0.2)
EOS (ABSOLUTE): 0.1 10*3/uL (ref 0.0–0.4)
EOS: 2 %
Hematocrit: 40.8 % (ref 34.0–46.6)
Hemoglobin: 13.7 g/dL (ref 11.1–15.9)
IMMATURE GRANS (ABS): 0 10*3/uL (ref 0.0–0.1)
IMMATURE GRANULOCYTES: 0 %
LYMPHS: 24 %
Lymphocytes Absolute: 1.4 10*3/uL (ref 0.7–3.1)
MCH: 29 pg (ref 26.6–33.0)
MCHC: 33.6 g/dL (ref 31.5–35.7)
MCV: 86 fL (ref 79–97)
MONOS ABS: 0.4 10*3/uL (ref 0.1–0.9)
Monocytes: 7 %
NEUTROS PCT: 67 %
Neutrophils Absolute: 3.8 10*3/uL (ref 1.4–7.0)
PLATELETS: 223 10*3/uL (ref 150–379)
RBC: 4.72 x10E6/uL (ref 3.77–5.28)
RDW: 13.9 % (ref 12.3–15.4)
WBC: 5.7 10*3/uL (ref 3.4–10.8)

## 2016-05-23 LAB — COMPREHENSIVE METABOLIC PANEL
ALK PHOS: 72 IU/L (ref 39–117)
ALT: 25 IU/L (ref 0–32)
AST: 21 IU/L (ref 0–40)
Albumin/Globulin Ratio: 1.8 (ref 1.2–2.2)
Albumin: 4.7 g/dL (ref 3.6–4.8)
BUN/Creatinine Ratio: 17 (ref 12–28)
BUN: 14 mg/dL (ref 8–27)
Bilirubin Total: 0.6 mg/dL (ref 0.0–1.2)
CALCIUM: 9.9 mg/dL (ref 8.7–10.3)
CO2: 22 mmol/L (ref 18–29)
CREATININE: 0.84 mg/dL (ref 0.57–1.00)
Chloride: 102 mmol/L (ref 96–106)
GFR calc Af Amer: 84 mL/min/{1.73_m2} (ref >59)
GFR, EST NON AFRICAN AMERICAN: 73 mL/min/{1.73_m2} (ref >59)
GLOBULIN, TOTAL: 2.6 g/dL (ref 1.5–4.5)
Glucose: 87 mg/dL (ref 65–99)
POTASSIUM: 4.8 mmol/L (ref 3.5–5.2)
SODIUM: 145 mmol/L — AB (ref 134–144)
Total Protein: 7.3 g/dL (ref 6.0–8.5)

## 2016-05-23 LAB — VITAMIN B12: VITAMIN B 12: 774 pg/mL (ref 232–1245)

## 2016-05-23 LAB — T4, FREE: Free T4: 1.29 ng/dL (ref 0.82–1.77)

## 2016-05-24 LAB — CYTOLOGY - PAP
DIAGNOSIS: NEGATIVE
HPV (WINDOPATH): NOT DETECTED

## 2016-05-31 ENCOUNTER — Telehealth: Payer: Self-pay | Admitting: Internal Medicine

## 2016-05-31 ENCOUNTER — Encounter: Payer: Self-pay | Admitting: Internal Medicine

## 2016-05-31 ENCOUNTER — Ambulatory Visit (INDEPENDENT_AMBULATORY_CARE_PROVIDER_SITE_OTHER): Payer: PPO | Admitting: Internal Medicine

## 2016-05-31 VITALS — BP 122/78 | HR 114 | Temp 100.8°F | Wt 134.0 lb

## 2016-05-31 DIAGNOSIS — R51 Headache: Secondary | ICD-10-CM

## 2016-05-31 DIAGNOSIS — R519 Headache, unspecified: Secondary | ICD-10-CM

## 2016-05-31 DIAGNOSIS — J029 Acute pharyngitis, unspecified: Secondary | ICD-10-CM | POA: Diagnosis not present

## 2016-05-31 DIAGNOSIS — R509 Fever, unspecified: Secondary | ICD-10-CM

## 2016-05-31 DIAGNOSIS — J101 Influenza due to other identified influenza virus with other respiratory manifestations: Secondary | ICD-10-CM | POA: Diagnosis not present

## 2016-05-31 LAB — POCT INFLUENZA A/B
INFLUENZA A, POC: POSITIVE — AB
Influenza B, POC: NEGATIVE

## 2016-05-31 LAB — POCT RAPID STREP A (OFFICE): RAPID STREP A SCREEN: NEGATIVE

## 2016-05-31 MED ORDER — OSELTAMIVIR PHOSPHATE 75 MG PO CAPS: 75.0000 mg | ORAL_CAPSULE | Freq: Two times a day (BID) | ORAL | 0 refills | Status: DC

## 2016-05-31 NOTE — Patient Instructions (Signed)
Sore Throat When you have a sore throat, your throat may:  Hurt.  Burn.  Feel irritated.  Feel scratchy. Many things can cause a sore throat, including:  An infection.  Allergies.  Dryness in the air.  Smoke or pollution.  Gastroesophageal reflux disease (GERD).  A tumor. A sore throat can be the first sign of another sickness. It can happen with other problems, like coughing or a fever. Most sore throats go away without treatment. Follow these instructions at home:  Take over-the-counter medicines only as told by your doctor.  Drink enough fluids to keep your pee (urine) clear or pale yellow.  Rest when you feel you need to.  To help with pain, try:  Sipping warm liquids, such as broth, herbal tea, or warm water.  Eating or drinking cold or frozen liquids, such as frozen ice pops.  Gargling with a salt-water mixture 3-4 times a day or as needed. To make a salt-water mixture, add -1 tsp of salt in 1 cup of warm water. Mix it until you cannot see the salt anymore.  Sucking on hard candy or throat lozenges.  Putting a cool-mist humidifier in your bedroom at night.  Sitting in the bathroom with the door closed for 5-10 minutes while you run hot water in the shower.  Do not use any tobacco products, such as cigarettes, chewing tobacco, and e-cigarettes. If you need help quitting, ask your doctor. Contact a doctor if:  You have a fever for more than 2-3 days.  You keep having symptoms for more than 2-3 days.  Your throat does not get better in 7 days.  You have a fever and your symptoms suddenly get worse. Get help right away if:  You have trouble breathing.  You cannot swallow fluids, soft foods, or your saliva.  You have swelling in your throat or neck that gets worse.  You keep feeling like you are going to throw up (vomit).  You keep throwing up. This information is not intended to replace advice given to you by your health care provider. Make sure  you discuss any questions you have with your health care provider. Document Released: 01/31/2008 Document Revised: 12/18/2015 Document Reviewed: 02/11/2015 Elsevier Interactive Patient Education  2017 Elsevier Inc.  

## 2016-05-31 NOTE — Telephone Encounter (Signed)
Patient Name: Catherine Strickland  DOB: 1951/03/28    Initial Comment Caller states she is sick, don't think she has strep. Kept two kids with strep this week. Little bit of sore throat from sinus drainage, with very little green. Urine ok, freezing at 101.8 yesterday, and now back to that temperature. Taken ibuproferen last night, which helped, antihistamine this morning. Gagging yesterday morning, and again this morning, but nothing to vomit. No stomach pain, bowels regular loose.    Nurse Assessment  Nurse: Raphael Gibney, RN, Vanita Ingles Date/Time Eilene Ghazi Time): 05/31/2016 12:30:39 PM  Confirm and document reason for call. If symptomatic, describe symptoms. ---Caller states she is coughing. She kept a couple of kids who had strep. Temp 102 earlier today. She has some nasal congestion. had chills all day yesterday. Has a sore throat. She is urinating. vomited at 5:30 am today x 1 and 1 time yesterday. She coughed up some green mucus this am. Temp 102.3 now.  Does the patient have any new or worsening symptoms? ---Yes  Will a triage be completed? ---Yes  Related visit to physician within the last 2 weeks? ---No  Does the PT have any chronic conditions? (i.e. diabetes, asthma, etc.) ---No  Is this a behavioral health or substance abuse call? ---No     Guidelines    Guideline Title Affirmed Question Affirmed Notes  Sinus Pain or Congestion [1] Fever > 101 F (38.3 C) AND [2] age > 100    Final Disposition User   See Physician within 4 Hours (or PCP triage) Raphael Gibney, RN, Vera    Comments  no appts available within 4 hrs at [facility] or [facility]. Pt does not want to go to urgent care. Please call pt back regarding appt.   Referrals  GO TO FACILITY REFUSED   Disagree/Comply: Disagree  Disagree/Comply Reason: Unable to access HCP

## 2016-05-31 NOTE — Progress Notes (Signed)
Subjective:    Patient ID: Catherine Strickland, female    DOB: 31-Oct-1950, 66 y.o.   MRN: [id number]  HPI  Pt presents to the clinic today with c/o headache, fever and sore throat. This started yesterday. She is having difficulty swallowing. She denies runny nose, nasal congestion, ear pain or cough. Her fever has been as high as 102.3. She has had chills and body aches. She has tried Ibuprofen with minimal relief. She has had sick contacts diagnosed with strep. She did not get her flu shot.  Review of Systems      No past medical history on file.  Current Outpatient Prescriptions  Medication Sig Dispense Refill  . cholecalciferol (VITAMIN D) 1000 UNITS tablet Take 1,000 Units by mouth daily.    . Cyanocobalamin (VITAMIN B-12) 500 MCG SUBL Place 500 mcg under the tongue daily.     No current facility-administered medications for this visit.     No Known Allergies  Family History  Problem Relation Age of Onset  . COPD Father   . Cancer Father     lung  . Cancer Mother     lung cancer  . Heart disease Neg Hx     Social History   Social History  . Marital status: Married    Spouse name: N/A  . Number of children: 5  . Years of education: N/A   Occupational History  . 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 Smoking status: Never Smoker  . Smokeless tobacco: Never Used  . Alcohol use No  . Drug use: No  . Sexual activity: Not on file   Other Topics Concern  . Not on file   Social History Narrative   Has living will   Husband is health care POA   Would accept resuscitation   No tube feeds if cognitively unaware     Constitutional: Pt reports headache and fever. Denies malaise, fatigue, headache or abrupt weight changes.  HEENT: Pt reports sore throat. Denies eye pain, eye redness, ear pain, ringing in the ears, wax buildup, runny nose, nasal congestion, bloody nose. Respiratory: Denies difficulty breathing,  shortness of breath, cough or sputum production.    No other specific complaints in a complete review of systems (except as listed in HPI above).  Objective:   Physical Exam   BP 122/78   Pulse (!) 114   Temp (!) 100.8 F (38.2 C) (Oral)   Wt 134 lb (60.8 kg)   SpO2 97%   BMI 21.63 kg/m  Wt Readings from Last 3 Encounters:  05/31/16 134 lb (60.8 kg)  05/22/16 139 lb (63 kg)  09/22/13 135 lb (61.2 kg)    General: Appears her stated age, NAD. HEENT: Head: normal shape and size, no sinus tenderness noted;  Ears: Tm's pink but intact, normal light reflex, + serous effusion bilaterally; Nose: mucosa pink and moist, septum midline; Throat/Mouth: Teeth present, mucosa erythematous and moist, no exudate, lesions or ulcerations noted.  Neck:  No adenopathy noted.  Cardiovascular: Tachycardic with normal rhythm. S1,S2 noted.  Pulmonary/Chest: Normal effort and positive vesicular breath sounds. No respiratory distress. No wheezes, rales or ronchi noted.   BMET    Component Value Date/Time   NA 145 (H) 05/22/2016 1347   K 4.8 05/22/2016 1347   CL 102 05/22/2016 1347   CO2 22 05/22/2016 1347   GLUCOSE 87 05/22/2016 1347  GLUCOSE 89 09/22/2013 1122   BUN 14 05/22/2016 1347   CREATININE 0.84 05/22/2016 1347   CALCIUM 9.9 05/22/2016 1347   GFRNONAA 73 05/22/2016 1347   GFRAA 84 05/22/2016 1347    Lipid Panel     Component Value Date/Time   CHOL 146 09/22/2013 1122   TRIG 62.0 09/22/2013 1122   HDL 52.00 09/22/2013 1122   CHOLHDL 3 09/22/2013 1122   VLDL 12.4 09/22/2013 1122   LDLCALC 82 09/22/2013 1122    CBC    Component Value Date/Time   WBC 5.7 05/22/2016 1347   WBC 5.0 09/22/2013 1122   RBC 4.72 05/22/2016 1347   RBC 4.68 09/22/2013 1122   HGB 14.0 09/22/2013 1122   HCT 40.8 05/22/2016 1347   PLT 223 05/22/2016 1347   MCV 86 05/22/2016 1347   MCH 29.0 05/22/2016 1347   MCHC 33.6 05/22/2016 1347   MCHC 34.0 09/22/2013 1122   RDW 13.9 05/22/2016 1347    LYMPHSABS 1.4 05/22/2016 1347   MONOABS 0.3 09/22/2013 1122   EOSABS 0.1 05/22/2016 1347   BASOSABS 0.0 05/22/2016 1347    Hgb A1C No results found for: HGBA1C         Assessment & Plan:   Influenza A:  RST: negative Rapid Flu: positive Salt water gargles for sore throat eRx for Tamiflu 75 mg BID x 5 days  Return precautions discussed Webb Silversmith, NP

## 2016-05-31 NOTE — Telephone Encounter (Signed)
Spoke to pt. I made her an appt with Rollene Fare at 215 today

## 2016-06-05 NOTE — Addendum Note (Signed)
Addended by: Lurlean Nanny on: 06/05/2016 03:25 PM   Modules accepted: Orders

## 2016-06-15 ENCOUNTER — Other Ambulatory Visit: Payer: Self-pay | Admitting: Internal Medicine

## 2016-06-15 DIAGNOSIS — Z1231 Encounter for screening mammogram for malignant neoplasm of breast: Secondary | ICD-10-CM

## 2016-06-28 DIAGNOSIS — H35373 Puckering of macula, bilateral: Secondary | ICD-10-CM | POA: Diagnosis not present

## 2016-07-26 ENCOUNTER — Ambulatory Visit
Admission: RE | Admit: 2016-07-26 | Discharge: 2016-07-26 | Disposition: A | Discharge status: Home / Self Care | Payer: PPO | Source: Ambulatory Visit | Attending: Internal Medicine | Admitting: Internal Medicine

## 2016-07-26 ENCOUNTER — Other Ambulatory Visit: Payer: Self-pay | Admitting: Internal Medicine

## 2016-07-26 DIAGNOSIS — Z1231 Encounter for screening mammogram for malignant neoplasm of breast: Secondary | ICD-10-CM

## 2016-07-26 DIAGNOSIS — M8588 Other specified disorders of bone density and structure, other site: Secondary | ICD-10-CM | POA: Diagnosis not present

## 2016-07-26 DIAGNOSIS — Z780 Asymptomatic menopausal state: Secondary | ICD-10-CM

## 2016-07-26 DIAGNOSIS — M85852 Other specified disorders of bone density and structure, left thigh: Secondary | ICD-10-CM | POA: Diagnosis not present

## 2016-07-31 ENCOUNTER — Inpatient Hospital Stay
Admission: RE | Admit: 2016-07-31 | Discharge: 2016-07-31 | Disposition: A | Discharge status: Home / Self Care | Payer: Self-pay | Source: Ambulatory Visit | Attending: *Deleted | Admitting: *Deleted

## 2016-07-31 ENCOUNTER — Other Ambulatory Visit: Payer: Self-pay | Admitting: *Deleted

## 2016-07-31 DIAGNOSIS — Z9289 Personal history of other medical treatment: Secondary | ICD-10-CM

## 2016-08-01 ENCOUNTER — Other Ambulatory Visit: Payer: Self-pay | Admitting: Internal Medicine

## 2016-08-01 DIAGNOSIS — R928 Other abnormal and inconclusive findings on diagnostic imaging of breast: Secondary | ICD-10-CM

## 2016-08-01 DIAGNOSIS — N6489 Other specified disorders of breast: Secondary | ICD-10-CM

## 2016-08-15 ENCOUNTER — Ambulatory Visit
Admission: RE | Admit: 2016-08-15 | Discharge: 2016-08-15 | Disposition: A | Discharge status: Home / Self Care | Payer: PPO | Source: Ambulatory Visit | Attending: Internal Medicine | Admitting: Internal Medicine

## 2016-08-15 DIAGNOSIS — R922 Inconclusive mammogram: Secondary | ICD-10-CM | POA: Diagnosis not present

## 2016-08-15 DIAGNOSIS — N6489 Other specified disorders of breast: Secondary | ICD-10-CM

## 2016-08-15 DIAGNOSIS — R928 Other abnormal and inconclusive findings on diagnostic imaging of breast: Secondary | ICD-10-CM

## 2017-07-08 DIAGNOSIS — H35373 Puckering of macula, bilateral: Secondary | ICD-10-CM | POA: Diagnosis not present

## 2017-10-25 IMAGING — MG MM DIGITAL DIAGNOSTIC UNILAT*R* W/ TOMO W/ CAD
9 series · 9 of 21 positions shown · non-contrast
Comparison: Previous exam(s).

CLINICAL DATA: Callback from screening mammogram for possible right
breast asymmetry

EXAM:
2D DIGITAL DIAGNOSTIC UNILATERAL RIGHT MAMMOGRAM WITH CAD AND
ADJUNCT TOMO

[R CC synth-2D (1 of 2)]
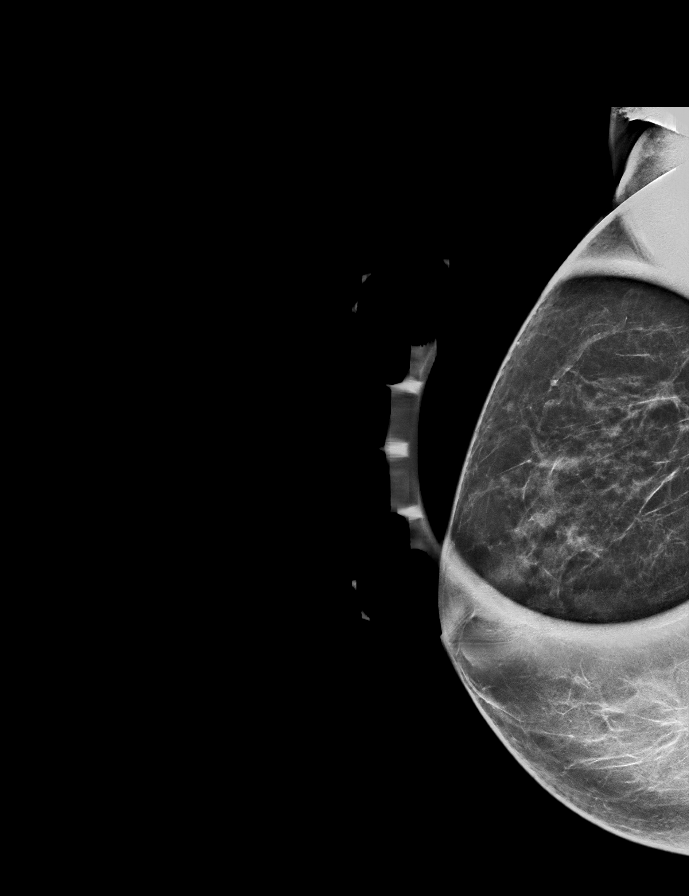

[R CC (1 of 2)]
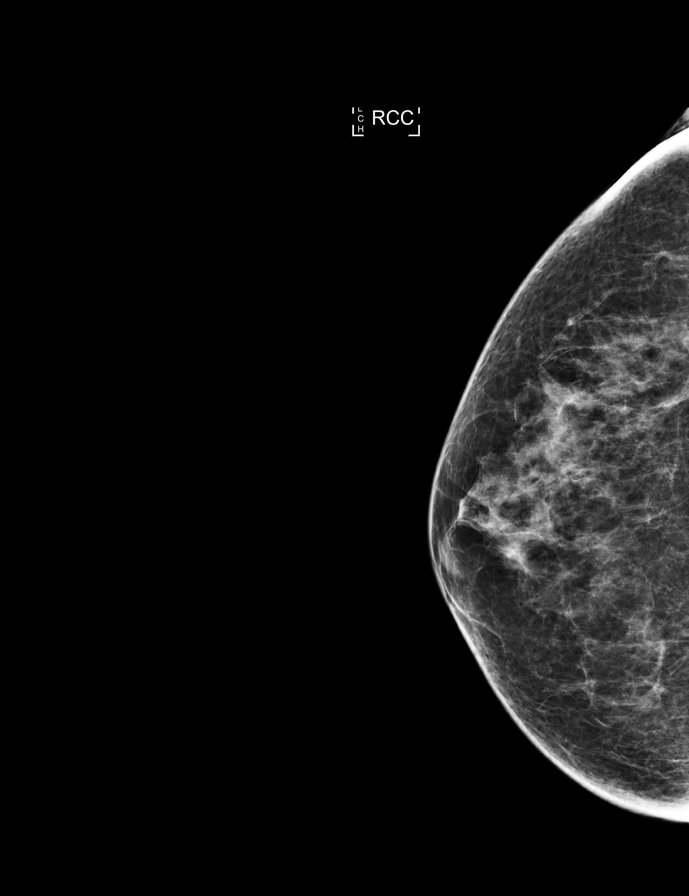

[R ML synth-2D]
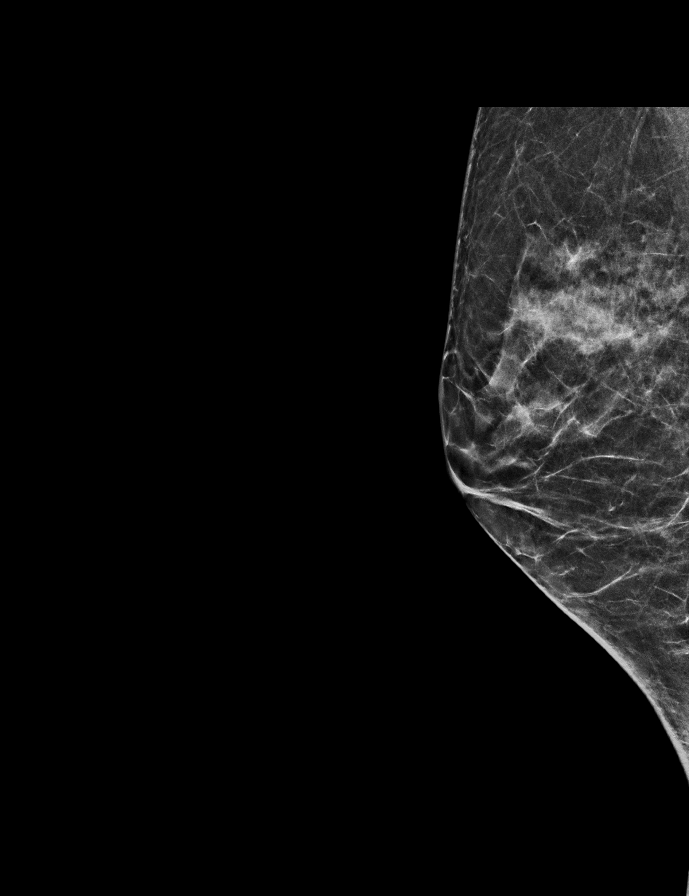

[R CC (2 of 2)]
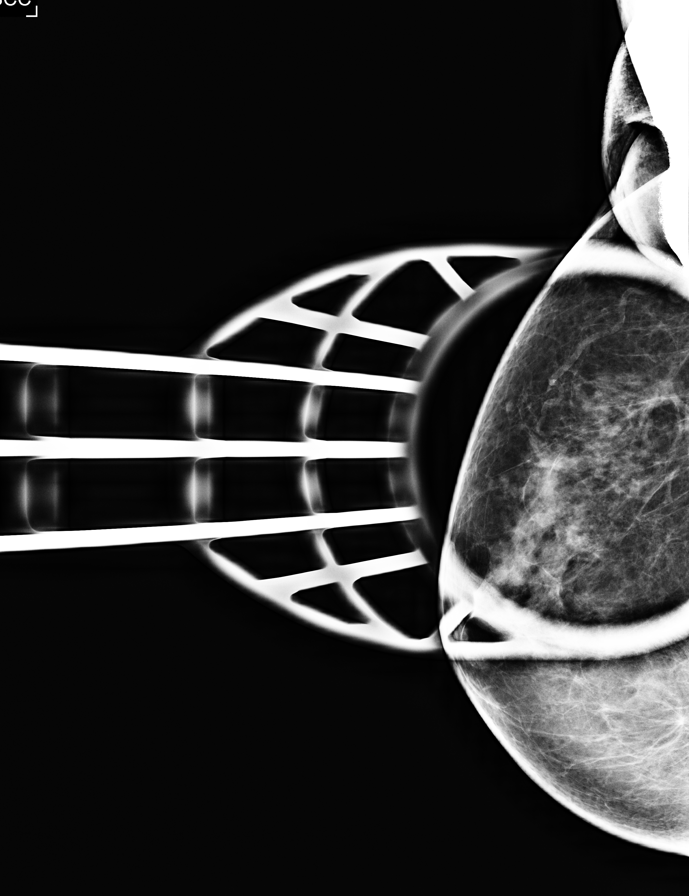

[R CC synth-2D (2 of 2)]
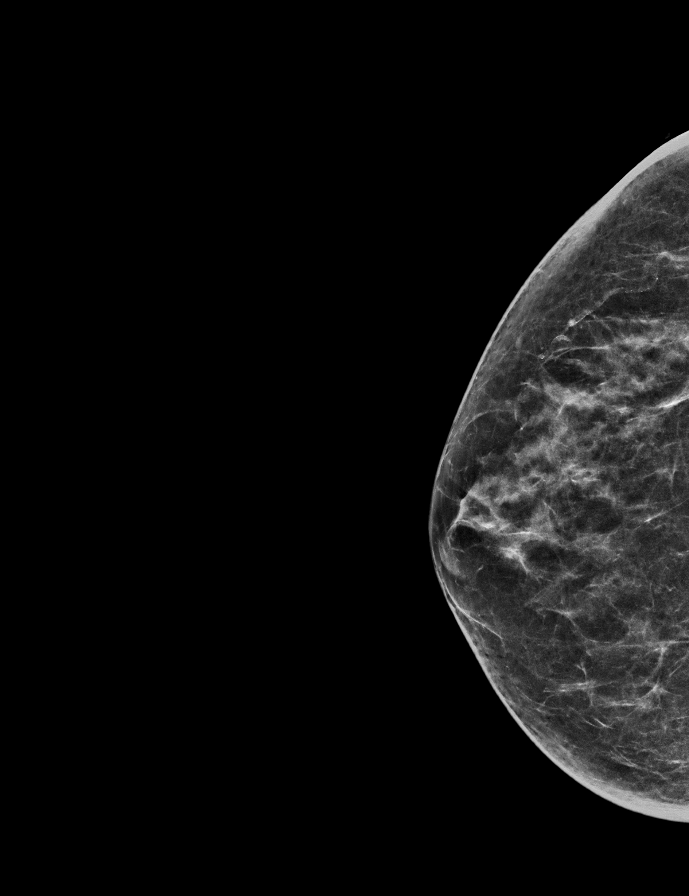

[R ML]
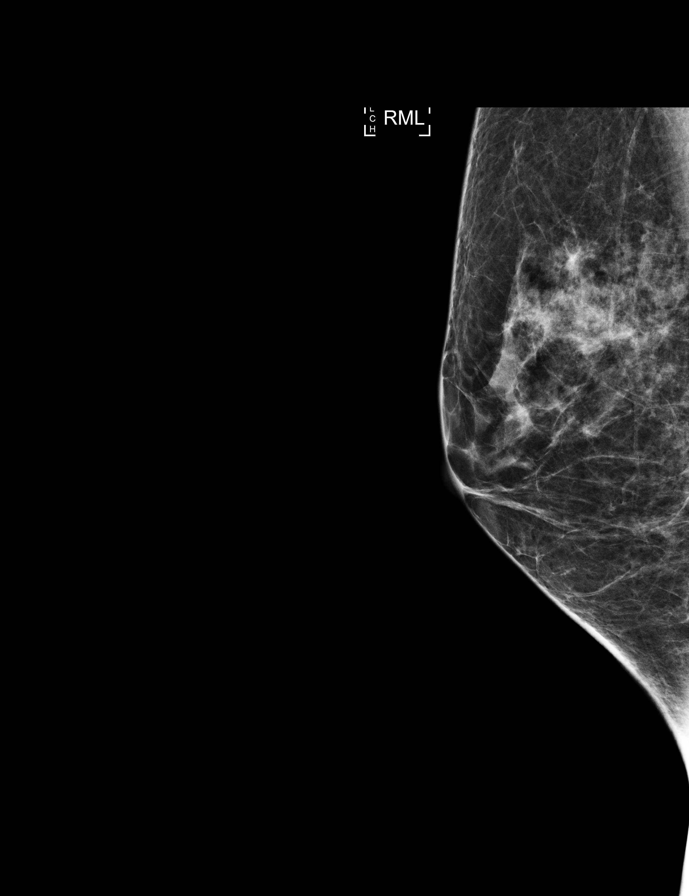

[R ML tomo · tomo slice 25/49.0]
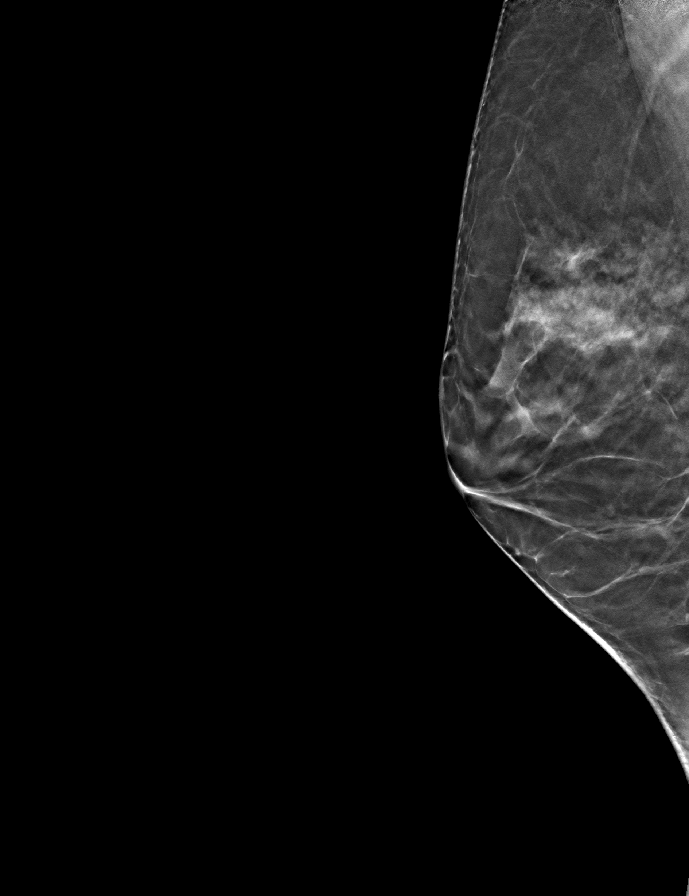

[R CC tomo (1 of 2) · tomo slice 27/52.0]
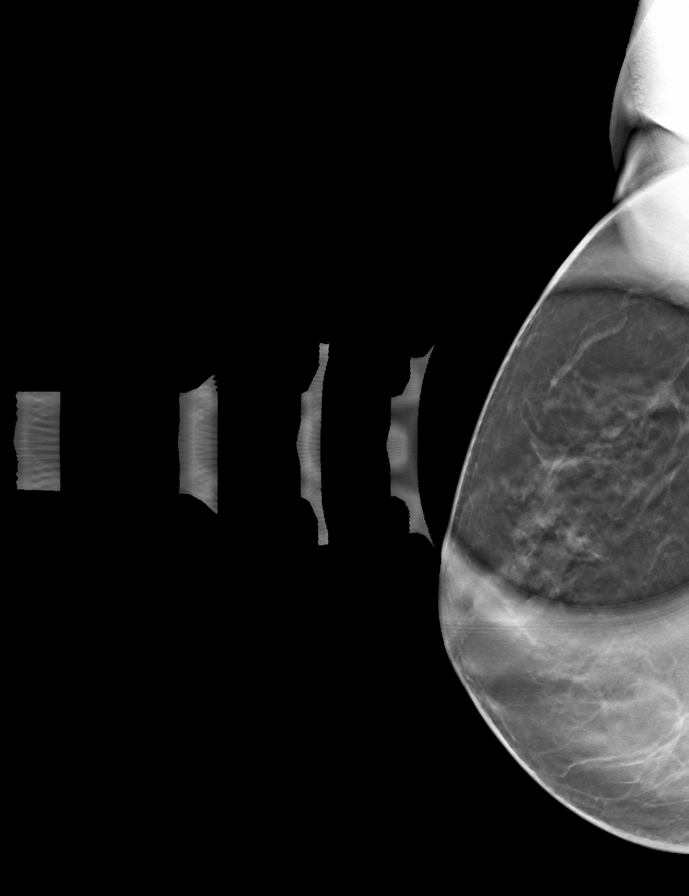

[R CC tomo (2 of 2) · tomo slice 29/57.0]
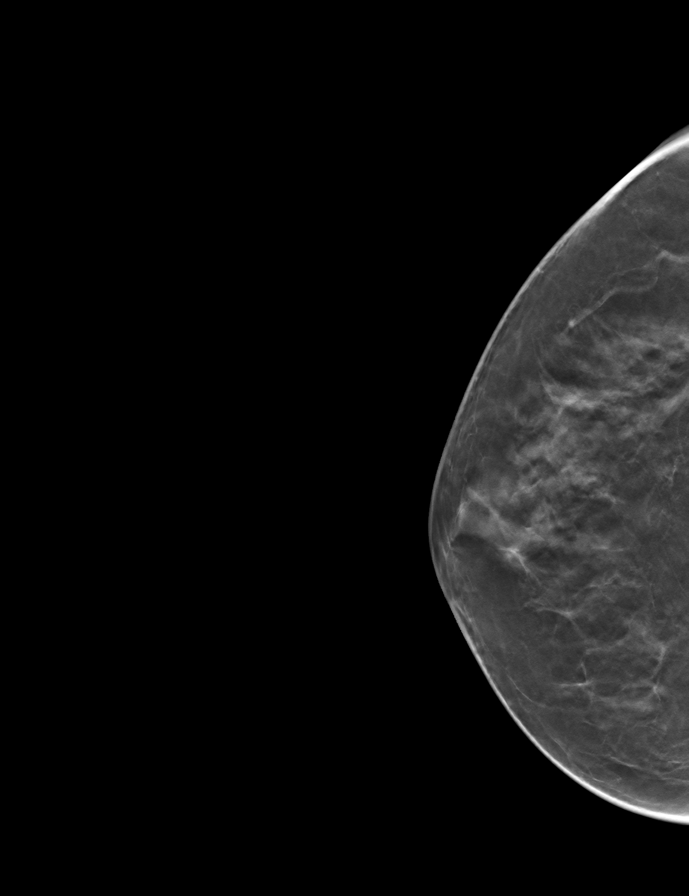

[9 of 21 positions shown; findings below may reference images not displayed]

ACR Breast Density Category c: The breast tissue is heterogeneously
dense, which may obscure small masses.
FINDINGS: Today's additional views demonstrate no persistent asymmetry or
other suspicious abnormality in the area of concern identified on
screening mammogram.

Mammographic images were processed with CAD.
IMPRESSION: No mammographic evidence of malignancy.

RECOMMENDATION:
Screening mammogram in one year.(Code:[id number])

I have discussed the findings and recommendations with the patient.
Results were also provided in writing at the conclusion of the
visit. If applicable, a reminder letter will be sent to the patient
regarding the next appointment.

BI-RADS CATEGORY  1: Negative.

## 2018-05-16 ENCOUNTER — Encounter: Payer: Self-pay | Admitting: Internal Medicine

## 2018-05-16 ENCOUNTER — Ambulatory Visit (INDEPENDENT_AMBULATORY_CARE_PROVIDER_SITE_OTHER): Payer: PPO | Admitting: Internal Medicine

## 2018-05-16 VITALS — BP 138/86 | HR 67 | Temp 98.1°F | Ht 66.0 in | Wt 143.0 lb

## 2018-05-16 DIAGNOSIS — Z0000 Encounter for general adult medical examination without abnormal findings: Secondary | ICD-10-CM

## 2018-05-16 DIAGNOSIS — Z7189 Other specified counseling: Secondary | ICD-10-CM | POA: Diagnosis not present

## 2018-05-16 DIAGNOSIS — R002 Palpitations: Secondary | ICD-10-CM | POA: Diagnosis not present

## 2018-05-16 DIAGNOSIS — E538 Deficiency of other specified B group vitamins: Secondary | ICD-10-CM | POA: Diagnosis not present

## 2018-05-16 LAB — COMPREHENSIVE METABOLIC PANEL
ALK PHOS: 67 U/L (ref 39–117)
ALT: 28 U/L (ref 0–35)
AST: 21 U/L (ref 0–37)
Albumin: 4.7 g/dL (ref 3.5–5.2)
BILIRUBIN TOTAL: 0.7 mg/dL (ref 0.2–1.2)
BUN: 13 mg/dL (ref 6–23)
CO2: 28 meq/L (ref 19–32)
CREATININE: 0.89 mg/dL (ref 0.40–1.20)
Calcium: 9.9 mg/dL (ref 8.4–10.5)
Chloride: 105 mEq/L (ref 96–112)
GFR: 67.02 mL/min (ref >60.00)
GLUCOSE: 89 mg/dL (ref 70–99)
Potassium: 5.1 mEq/L (ref 3.5–5.1)
Sodium: 140 mEq/L (ref 135–145)
TOTAL PROTEIN: 7.3 g/dL (ref 6.0–8.3)

## 2018-05-16 LAB — CBC
HEMATOCRIT: 44.8 % (ref 36.0–46.0)
HEMOGLOBIN: 15.1 g/dL — AB (ref 12.0–15.0)
MCHC: 33.8 g/dL (ref 30.0–36.0)
MCV: 86.6 fl (ref 78.0–100.0)
Platelets: 247 10*3/uL (ref 150.0–400.0)
RBC: 5.17 Mil/uL — ABNORMAL HIGH (ref 3.87–5.11)
RDW: 13.5 % (ref 11.5–15.5)
WBC: 5.7 10*3/uL (ref 4.0–10.5)

## 2018-05-16 LAB — LIPID PANEL
Cholesterol: 137 mg/dL (ref 0–200)
HDL: 48.8 mg/dL (ref >39.00)
LDL Cholesterol: 73 mg/dL (ref 0–99)
NonHDL: 87.8
Total CHOL/HDL Ratio: 3
Triglycerides: 76 mg/dL (ref 0.0–149.0)
VLDL: 15.2 mg/dL (ref 0.0–40.0)

## 2018-05-16 LAB — T4, FREE: Free T4: 0.96 ng/dL (ref 0.60–1.60)

## 2018-05-16 NOTE — Assessment & Plan Note (Signed)
I have personally reviewed the Medicare Annual Wellness questionnaire and have noted 1. The patient's medical and social history 2. Their use of alcohol, tobacco or illicit drugs 3. Their current medications and supplements 4. The patient's functional ability including ADL's, fall risks, home safety risks and hearing or visual             impairment. 5. Diet and physical activities 6. Evidence for depression or mood disorders  The patients weight, height, BMI and visual acuity have been recorded in the chart I have made referrals, counseling and provided education to the patient based review of the above and I have provided the pt with a written personalized care plan for preventive services.  I have provided you with a copy of your personalized plan for preventive services. Please take the time to review along with your updated medication list.  Getting shingrix next week Will do pneumovax Discussed exercise Yearly flu vaccine---will get after the shingrix Due for colonoscopy (due to [place]) She will set up mammogram

## 2018-05-16 NOTE — Assessment & Plan Note (Signed)
Nothing new Sounds like very brief SVT Check labs Cardiology if worsens

## 2018-05-16 NOTE — Assessment & Plan Note (Signed)
Takes supplements

## 2018-05-16 NOTE — Progress Notes (Signed)
Subjective:    Patient ID: Catherine Strickland, female    DOB: 08/20/1950, 68 y.o.   MRN: [id number]  HPI Here for Medicare wellness visit and follow up of chronic health conditions Reviewed form and advanced directives Reviewed other doctors No alcohol or tobacco Not exercising---discussed Vision is okay Hearing is a problem (for TV) but did okay on the testing No falls No depression or anhedonia Independent with instrumental ADLs No change in memory--mild issues  Mostly retired Research scientist (medical)  Having an increase in acid reflux Takes baking soda with improvement--but not commonly No dysphagia No obvious food triggers Usually at night  Had stomach bug on Christmas day Had RLQ pain that lasted a day--colicky -3 days ago Gone the next day Then sore feeling twice yesterday  No fever No tenderness Appetite was down that day---otherwise fine  Occasional palpitations Still will awaken her at night at times. Seems to notice more when it stops Racing will last a minute or less Occurs 1-2 times per month (or more) No chest pain No SOB No dizziness or syncope Limited coffee now--no relationship that she can tell  Continues on vitamin B12 No abnormal sensations  Current Outpatient Medications on File Prior to Visit  Medication Sig Dispense Refill  . Cyanocobalamin (VITAMIN B-12) 500 MCG SUBL Place 500 mcg under the tongue daily.    . cholecalciferol (VITAMIN D) 1000 UNITS tablet Take 1,000 Units by mouth daily.     No current facility-administered medications on file prior to visit.     No Known Allergies  History reviewed. No pertinent past medical history.  Past Surgical History:  Procedure Laterality Date  . ABDOMINAL HYSTERECTOMY    . UTERINE FIBROID SURGERY    . VAGINAL DELIVERY     x5    Family History  Problem Relation Age of Onset  . COPD Father   . Cancer Father        lung  . Cancer Mother        lung cancer  . Heart disease Neg Hx     . Breast cancer Neg Hx     Social History   Socioeconomic History  . Marital status: Married    Spouse name: Not on file  . Number of children: 5  . Years of education: Not on file  . Highest education level: Not on file  Occupational History  . 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 Occupation: Musician    Comment: church  Social Needs  . Financial resource strain: Not on file  . Food insecurity:    Worry: Not on file    Inability: Not on file  . Transportation needs:    Medical: Not on file    Non-medical: Not on file  Tobacco Use  . Smoking status: Never Smoker  . Smokeless tobacco: Never Used  Substance and Sexual Activity  . Alcohol use: No  . Drug use: No  . Sexual activity: Not on file  Lifestyle  . Physical activity:    Days per week: Not on file    Minutes per session: Not on file  . Stress: Not on file  Relationships  . Social connections:    Talks on phone: Not on file    Gets together: Not on file    Attends religious service: Not on file    Active member of club or organization: Not on file    Attends meetings of clubs or organizations: Not on  file    Relationship status: Not on file  . Intimate partner violence:    Fear of current or ex partner: Not on file    Emotionally abused: Not on file    Physically abused: Not on file    Forced sexual activity: Not on file  Other Topics Concern  . Not on file  Social History Narrative   Has living will   Husband is health care POA   Would accept resuscitation   No tube feeds if cognitively unaware   Review of Systems Appetite is generally good Weight is up some Sleeps okay Wears seat belt Teeth okay---keeps up with dentist No skin problems---plans to see dermatologist for routine exam Bowels are fine. No blood Voids okay--but more urgency now. No incontinence No sig back or joint pain. Does see chiropractor at times      Objective:   Physical Exam  Constitutional: She  is oriented to person, place, and time. She appears well-developed. No distress.  HENT:  Mouth/Throat: Oropharynx is clear and moist. No oropharyngeal exudate.  Neck: No thyromegaly present.  Cardiovascular: Normal rate, regular rhythm, normal heart sounds and intact distal pulses. Exam reveals no gallop.  No murmur heard. Respiratory: Effort normal and breath sounds normal. No respiratory distress. She has no wheezes. She has no rales.  GI: Soft. There is no abdominal tenderness.  Musculoskeletal:        General: No tenderness or edema.  Lymphadenopathy:    She has no cervical adenopathy.  Neurological: She is alert and oriented to person, place, and time.  President--- "Dwaine Deter, [place]" [phone] D-l-r-o-w Recall 3/3  Skin: No rash noted. No erythema.  Psychiatric: She has a normal mood and affect. Her behavior is normal.           Assessment & Plan:

## 2018-05-16 NOTE — Progress Notes (Signed)
Hearing Screening   Method: Audiometry   125Hz 250Hz 500Hz 1000Hz 2000Hz 3000Hz 4000Hz 6000Hz 8000Hz  Right ear:   20 20 20  20    Left ear:   20 20 20  20    Vision Screening Comments: October 2019   

## 2018-05-16 NOTE — Assessment & Plan Note (Signed)
See social history 

## 2018-05-16 NOTE — Patient Instructions (Addendum)
I would recommend over the counter famotidine 20mg  (at night or twice a day) when heartburn gets bad. You can take it before anything that tends to bring on the heartburn. Please check with Dr Vira Agar about your colonoscopy. Please set up your screening mammogram.

## 2019-01-22 ENCOUNTER — Other Ambulatory Visit: Payer: Self-pay | Admitting: Internal Medicine

## 2019-01-22 DIAGNOSIS — Z1231 Encounter for screening mammogram for malignant neoplasm of breast: Secondary | ICD-10-CM

## 2019-02-13 DIAGNOSIS — R030 Elevated blood-pressure reading, without diagnosis of hypertension: Secondary | ICD-10-CM | POA: Diagnosis not present

## 2019-02-13 DIAGNOSIS — Z800 Family history of malignant neoplasm of digestive organs: Secondary | ICD-10-CM | POA: Diagnosis not present

## 2019-02-25 DIAGNOSIS — L821 Other seborrheic keratosis: Secondary | ICD-10-CM | POA: Diagnosis not present

## 2019-02-25 DIAGNOSIS — D229 Melanocytic nevi, unspecified: Secondary | ICD-10-CM | POA: Diagnosis not present

## 2019-02-25 DIAGNOSIS — D1800 Hemangioma unspecified site: Secondary | ICD-10-CM | POA: Diagnosis not present

## 2019-02-25 DIAGNOSIS — L853 Xerosis cutis: Secondary | ICD-10-CM | POA: Diagnosis not present

## 2019-02-25 DIAGNOSIS — D225 Melanocytic nevi of trunk: Secondary | ICD-10-CM | POA: Diagnosis not present

## 2019-02-25 DIAGNOSIS — D2361 Other benign neoplasm of skin of right upper limb, including shoulder: Secondary | ICD-10-CM | POA: Diagnosis not present

## 2019-02-25 DIAGNOSIS — L814 Other melanin hyperpigmentation: Secondary | ICD-10-CM | POA: Diagnosis not present

## 2019-02-25 DIAGNOSIS — Z1283 Encounter for screening for malignant neoplasm of skin: Secondary | ICD-10-CM | POA: Diagnosis not present

## 2019-02-25 DIAGNOSIS — L578 Other skin changes due to chronic exposure to nonionizing radiation: Secondary | ICD-10-CM | POA: Diagnosis not present

## 2019-02-25 DIAGNOSIS — L209 Atopic dermatitis, unspecified: Secondary | ICD-10-CM | POA: Diagnosis not present

## 2019-02-25 DIAGNOSIS — D2230 Melanocytic nevi of unspecified part of face: Secondary | ICD-10-CM | POA: Diagnosis not present

## 2019-03-19 ENCOUNTER — Ambulatory Visit
Admission: RE | Admit: 2019-03-19 | Discharge: 2019-03-19 | Disposition: A | Discharge status: Home / Self Care | Payer: PPO | Source: Ambulatory Visit | Attending: Internal Medicine | Admitting: Internal Medicine

## 2019-03-19 DIAGNOSIS — Z1231 Encounter for screening mammogram for malignant neoplasm of breast: Secondary | ICD-10-CM | POA: Diagnosis not present

## 2019-05-22 ENCOUNTER — Encounter: Payer: Self-pay | Admitting: Internal Medicine

## 2019-07-31 ENCOUNTER — Other Ambulatory Visit: Payer: Self-pay

## 2019-07-31 ENCOUNTER — Encounter: Payer: Self-pay | Admitting: Internal Medicine

## 2019-07-31 ENCOUNTER — Ambulatory Visit (INDEPENDENT_AMBULATORY_CARE_PROVIDER_SITE_OTHER): Payer: PPO | Admitting: Internal Medicine

## 2019-07-31 VITALS — BP 136/84 | HR 66 | Temp 97.5°F | Ht 66.0 in | Wt 131.0 lb

## 2019-07-31 DIAGNOSIS — E538 Deficiency of other specified B group vitamins: Secondary | ICD-10-CM

## 2019-07-31 DIAGNOSIS — R1031 Right lower quadrant pain: Secondary | ICD-10-CM | POA: Diagnosis not present

## 2019-07-31 DIAGNOSIS — Z7189 Other specified counseling: Secondary | ICD-10-CM | POA: Diagnosis not present

## 2019-07-31 DIAGNOSIS — D179 Benign lipomatous neoplasm, unspecified: Secondary | ICD-10-CM | POA: Insufficient documentation

## 2019-07-31 DIAGNOSIS — D1722 Benign lipomatous neoplasm of skin and subcutaneous tissue of left arm: Secondary | ICD-10-CM | POA: Diagnosis not present

## 2019-07-31 DIAGNOSIS — Z0000 Encounter for general adult medical examination without abnormal findings: Secondary | ICD-10-CM | POA: Diagnosis not present

## 2019-07-31 DIAGNOSIS — Z23 Encounter for immunization: Secondary | ICD-10-CM

## 2019-07-31 LAB — CBC
HCT: 38.6 % (ref 36.0–46.0)
Hemoglobin: 13.1 g/dL (ref 12.0–15.0)
MCHC: 34 g/dL (ref 30.0–36.0)
MCV: 87.7 fl (ref 78.0–100.0)
Platelets: 217 10*3/uL (ref 150.0–400.0)
RBC: 4.4 Mil/uL (ref 3.87–5.11)
RDW: 13.3 % (ref 11.5–15.5)
WBC: 4.9 10*3/uL (ref 4.0–10.5)

## 2019-07-31 LAB — COMPREHENSIVE METABOLIC PANEL
ALT: 11 U/L (ref 0–35)
AST: 13 U/L (ref 0–37)
Albumin: 4.5 g/dL (ref 3.5–5.2)
Alkaline Phosphatase: 63 U/L (ref 39–117)
BUN: 14 mg/dL (ref 6–23)
CO2: 29 mEq/L (ref 19–32)
Calcium: 9.3 mg/dL (ref 8.4–10.5)
Chloride: 106 mEq/L (ref 96–112)
Creatinine, Ser: 0.83 mg/dL (ref 0.40–1.20)
GFR: 68.1 mL/min (ref >60.00)
Glucose, Bld: 87 mg/dL (ref 70–99)
Potassium: 4 mEq/L (ref 3.5–5.1)
Sodium: 140 mEq/L (ref 135–145)
Total Bilirubin: 0.6 mg/dL (ref 0.2–1.2)
Total Protein: 7 g/dL (ref 6.0–8.3)

## 2019-07-31 LAB — VITAMIN D 25 HYDROXY (VIT D DEFICIENCY, FRACTURES): VITD: 83.42 ng/mL (ref 30.00–100.00)

## 2019-07-31 LAB — VITAMIN B12: Vitamin B-12: >1500 pg/mL — ABNORMAL HIGH (ref 211–911)

## 2019-07-31 NOTE — Addendum Note (Signed)
Addended by: Pilar Grammes on: 07/31/2019 12:54 PM   Modules accepted: Orders

## 2019-07-31 NOTE — Assessment & Plan Note (Signed)
See social history 

## 2019-07-31 NOTE — Assessment & Plan Note (Signed)
Vague Not related to bowels, eating, voiding, activity She will monitor and email if any changes or worsening

## 2019-07-31 NOTE — Assessment & Plan Note (Signed)
Would refer to surgeon if she wants it removed

## 2019-07-31 NOTE — Assessment & Plan Note (Signed)
On supplements Will recheck levels

## 2019-07-31 NOTE — Assessment & Plan Note (Addendum)
I have personally reviewed the Medicare Annual Wellness questionnaire and have noted 1. The patient's medical and social history 2. Their use of alcohol, tobacco or illicit drugs 3. Their current medications and supplements 4. The patient's functional ability including ADL's, fall risks, home safety risks and hearing or visual             impairment. 5. Diet and physical activities 6. Evidence for depression or mood disorders  The patients weight, height, BMI and visual acuity have been recorded in the chart I have made referrals, counseling and provided education to the patient based review of the above and I have provided the pt with a written personalized care plan for preventive services.  I have provided you with a copy of your personalized plan for preventive services. Please take the time to review along with your updated medication list.  Colonoscopy next month Mammogram every 1-2 years---last in November Pneumovax today Exercising regularly Flu vaccine in the fall Td if injury

## 2019-07-31 NOTE — Progress Notes (Signed)
Hearing Screening   Method: Audiometry   125Hz  250Hz  500Hz  1000Hz  2000Hz  3000Hz  4000Hz  6000Hz  8000Hz   Right ear:   20 20 20  20     Left ear:   20 20 20   40    Vision Screening Comments: Scheduling appt for the next month

## 2019-07-31 NOTE — Progress Notes (Signed)
Subjective:    Patient ID: Catherine Strickland, female    DOB: March 19, 1951, 69 y.o.   MRN: [id number]  HPI Here for Medicare wellness visit and follow up of chronic health conditions This visit occurred during the SARS-CoV-2 public health emergency.  Safety protocols were in place, including screening questions prior to the visit, additional usage of staff PPE, and extensive cleaning of exam room while observing appropriate contact time as indicated for disinfecting solutions.   Reviewed form and advanced directives  Reviewed other doctors No alcohol or tobacco Has been walking regularly Vision is fine Husband feels her hearing is poor--affected in crowded rooms No falls No depression or anhedonia Independent with instrumental ADLs No memory problems  Had bad rash during COVID Similar to one she had 4 years ago Thought it was nerves then ---and 4 months recently This did resolve Now on TAC for scaly spot on posterior left shoulder (helps itching)  Apparent fluid pocket on anterior left shoulder is bigger Affects her clothes Wonders about getting it drained  Has some rectal sensation Wonders about pinworm Is getting OTC med for this now  Continues on B12 supplements  No palpitations  Current Outpatient Medications on File Prior to Visit  Medication Sig Dispense Refill  . cholecalciferol (VITAMIN D) 1000 UNITS tablet Take 1,000 Units by mouth daily.    . Cyanocobalamin (VITAMIN B-12) 500 MCG SUBL Place 500 mcg under the tongue daily.     No current facility-administered medications on file prior to visit.    No Known Allergies  History reviewed. No pertinent past medical history.  Past Surgical History:  Procedure Laterality Date  . ABDOMINAL HYSTERECTOMY    . UTERINE FIBROID SURGERY    . VAGINAL DELIVERY     x5    Family History  Problem Relation Age of Onset  . COPD Father   . Cancer Father        lung  . Cancer Mother        lung cancer  . Heart disease  Neg Hx   . Breast cancer Neg Hx     Social History   Socioeconomic History  . Marital status: Married    Spouse name: Not on file  . Number of children: 5  . Years of education: Not on file  . Highest education level: Not on file  Occupational History  . 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 Occupation: Musician    Comment: church  Tobacco Use  . Smoking status: Never Smoker  . Smokeless tobacco: Never Used  Substance and Sexual Activity  . Alcohol use: No  . Drug use: No  . Sexual activity: Not on file  Other Topics Concern  . Not on file  Social History Narrative   Has living will   Husband is health care POA--alternate Sandrea Matte (daughter)   Would accept resuscitation   No tube feeds if cognitively unaware   Social Determinants of Health   Financial Resource Strain:   . Difficulty of Paying Living Expenses:   Food Insecurity:   . Worried About Charity fundraiser in the Last Year:   . Arboriculturist in the Last Year:   Transportation Needs:   . Film/video editor (Medical):   Marland Kitchen Lack of Transportation (Non-Medical):   Physical Activity:   . Days of Exercise per Week:   . Minutes of Exercise per Session:   Stress:   . Feeling of Stress :  Social Connections:   . Frequency of Communication with Friends and Family:   . Frequency of Social Gatherings with Friends and Family:   . Attends Religious Services:   . Active Member of Clubs or Organizations:   . Attends Archivist Meetings:   Marland Kitchen Marital Status:   Intimate Partner Violence:   . Fear of Current or Ex-Partner:   . Emotionally Abused:   Marland Kitchen Physically Abused:   . Sexually Abused:    Review of Systems Appetite is fine Had gained weight at the beginning of COVID--stopped that Has lost some weight recently Sleeps well Wears seat belt Teeth okay--keeps up with dentist Rare heartburn--baking soda rarely. No dysphagia Bowels are fine. No blood No chest pain or  SOB No dizziness or syncope No edema No sig back or joint pains---does have some back/hip stiffness when getting up     Objective:   Physical Exam  Constitutional: She is oriented to person, place, and time. She appears well-developed. No distress.  HENT:  Mouth/Throat: Oropharynx is clear and moist. No oropharyngeal exudate.  Neck: No thyromegaly present.  Cardiovascular: Normal rate, regular rhythm, normal heart sounds and intact distal pulses. Exam reveals no gallop.  No murmur heard. Respiratory: Effort normal and breath sounds normal. No respiratory distress. She has no wheezes. She has no rales.  GI: Soft. She exhibits no distension. There is no abdominal tenderness. There is no rebound and no guarding.  No obvious diagnosis for vague abd symptoms  Musculoskeletal:        General: No tenderness or edema.  Lymphadenopathy:    She has no cervical adenopathy.  Neurological: She is alert and oriented to person, place, and time.  President--- "Zoila Shutter, Obama" [phone] D-l-r-o-w Recall 3/3  Skin: No rash noted. No erythema.  ~6cm apparent lipoma over anterior left shoulder. Doesn't seem to communicate with shoulder joint  Psychiatric: She has a normal mood and affect. Her behavior is normal.           Assessment & Plan:

## 2019-08-13 DIAGNOSIS — H35373 Puckering of macula, bilateral: Secondary | ICD-10-CM | POA: Diagnosis not present

## 2019-08-13 DIAGNOSIS — H524 Presbyopia: Secondary | ICD-10-CM | POA: Diagnosis not present

## 2019-08-13 DIAGNOSIS — H5213 Myopia, bilateral: Secondary | ICD-10-CM | POA: Diagnosis not present

## 2019-08-13 DIAGNOSIS — H52223 Regular astigmatism, bilateral: Secondary | ICD-10-CM | POA: Diagnosis not present

## 2019-08-24 ENCOUNTER — Other Ambulatory Visit: Admission: RE | Admit: 2019-08-24 | Payer: PPO | Source: Ambulatory Visit

## 2019-08-26 ENCOUNTER — Encounter: Admission: RE | Payer: Self-pay | Source: Home / Self Care

## 2019-08-26 ENCOUNTER — Ambulatory Visit: Admission: RE | Admit: 2019-08-26 | Payer: PPO | Source: Home / Self Care | Admitting: Internal Medicine

## 2019-08-26 SURGERY — COLONOSCOPY WITH PROPOFOL
Anesthesia: General

## 2019-12-01 DIAGNOSIS — H903 Sensorineural hearing loss, bilateral: Secondary | ICD-10-CM | POA: Diagnosis not present

## 2020-02-09 DIAGNOSIS — Z01818 Encounter for other preprocedural examination: Secondary | ICD-10-CM | POA: Diagnosis not present

## 2020-02-12 DIAGNOSIS — Z800 Family history of malignant neoplasm of digestive organs: Secondary | ICD-10-CM | POA: Diagnosis not present

## 2020-02-12 DIAGNOSIS — Z1211 Encounter for screening for malignant neoplasm of colon: Secondary | ICD-10-CM | POA: Diagnosis not present

## 2020-02-12 DIAGNOSIS — K640 First degree hemorrhoids: Secondary | ICD-10-CM | POA: Diagnosis not present

## 2020-07-19 ENCOUNTER — Other Ambulatory Visit: Payer: Self-pay | Admitting: Internal Medicine

## 2020-07-19 DIAGNOSIS — Z1231 Encounter for screening mammogram for malignant neoplasm of breast: Secondary | ICD-10-CM

## 2020-07-20 ENCOUNTER — Encounter: Payer: Self-pay | Admitting: Internal Medicine

## 2020-07-20 ENCOUNTER — Other Ambulatory Visit: Payer: Self-pay

## 2020-07-20 ENCOUNTER — Ambulatory Visit (INDEPENDENT_AMBULATORY_CARE_PROVIDER_SITE_OTHER)
Admission: RE | Admit: 2020-07-20 | Discharge: 2020-07-20 | Disposition: A | Discharge status: Home / Self Care | Payer: PPO | Source: Ambulatory Visit | Attending: Internal Medicine | Admitting: Internal Medicine

## 2020-07-20 ENCOUNTER — Ambulatory Visit (INDEPENDENT_AMBULATORY_CARE_PROVIDER_SITE_OTHER): Payer: PPO | Admitting: Internal Medicine

## 2020-07-20 VITALS — BP 140/84 | HR 62 | Temp 97.4°F | Ht 66.0 in | Wt 132.0 lb

## 2020-07-20 DIAGNOSIS — K429 Umbilical hernia without obstruction or gangrene: Secondary | ICD-10-CM | POA: Insufficient documentation

## 2020-07-20 DIAGNOSIS — M79672 Pain in left foot: Secondary | ICD-10-CM | POA: Insufficient documentation

## 2020-07-20 DIAGNOSIS — M19049 Primary osteoarthritis, unspecified hand: Secondary | ICD-10-CM | POA: Insufficient documentation

## 2020-07-20 DIAGNOSIS — M25531 Pain in right wrist: Secondary | ICD-10-CM

## 2020-07-20 DIAGNOSIS — S99922A Unspecified injury of left foot, initial encounter: Secondary | ICD-10-CM | POA: Diagnosis not present

## 2020-07-20 NOTE — Assessment & Plan Note (Addendum)
Has nodule and pain Likely arthritic Will check x-ray  Just arthritis Discussed diclofenac topical

## 2020-07-20 NOTE — Assessment & Plan Note (Addendum)
Has likely hernia of fat---but doesn't seem big enough for intestines Seems to be related to her lifting/bending tasks in yard Discussed proper technique to avoid groin pain/strain---should help umbilicus as well Might need surgical evaluation if pain persists  Is planning to see Dr Bary Castilla about the shoulder lipoma so can mention this also

## 2020-07-20 NOTE — Patient Instructions (Signed)
Get a high quality pair of stability shoes for walking. You can try over the counter diclofenac gel for your thumb/wrist

## 2020-07-20 NOTE — Assessment & Plan Note (Addendum)
Seems to have had a twisting injury 2 months ago Now sudden worse pain Will check x-ray Likely related to lack of support shoes  X-ray negative Would just try the better shoes

## 2020-07-20 NOTE — Progress Notes (Signed)
Subjective:    Patient ID: Catherine Strickland, female    DOB: 31-Mar-1951, 70 y.o.   MRN: [id number]  HPI Here with multiple concerns This visit occurred during the SARS-CoV-2 public health emergency.  Safety protocols were in place, including screening questions prior to the visit, additional usage of staff PPE, and extensive cleaning of exam room while observing appropriate contact time as indicated for disinfecting solutions.   Most concerned about her left foot Stepped on root or rock on lateral foot--has bothered her some since then (2 months ago) Then stepped wrong--3 days ago. Had bad pain along lateral mid foot---radiates to ankle  Will get episodic pain in both feet---between 2nd and 3rd toes "like a gristle there"  Has painful knot at base of right thumb Pain with playing piano at times Goes back for at least a year---did hurt it 5 years ago in fall  Current Outpatient Medications on File Prior to Visit  Medication Sig Dispense Refill  . cholecalciferol (VITAMIN D) 1000 UNITS tablet Take 1,000 Units by mouth daily.    . Cyanocobalamin (VITAMIN B-12) 500 MCG SUBL Place 500 mcg under the tongue daily.     No current facility-administered medications on file prior to visit.    No Known Allergies  History reviewed. No pertinent past medical history.  Past Surgical History:  Procedure Laterality Date  . ABDOMINAL HYSTERECTOMY    . UTERINE FIBROID SURGERY    . VAGINAL DELIVERY     x5    Family History  Problem Relation Age of Onset  . COPD Father   . Cancer Father        lung  . Cancer Mother        lung cancer  . Heart disease Neg Hx   . Breast cancer Neg Hx     Social History   Socioeconomic History  . Marital status: Married    Spouse name: Not on file  . Number of children: 5  . Years of education: Not on file  . Highest education level: Not on file  Occupational History  . 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 Occupation:  Musician    Comment: church  Tobacco Use  . Smoking status: Never Smoker  . Smokeless tobacco: Never Used  Substance and Sexual Activity  . Alcohol use: No  . Drug use: No  . Sexual activity: Not on file  Other Topics Concern  . Not on file  Social History Narrative   Has living will   Husband is health care POA--alternate Sandrea Matte (daughter)   Would accept resuscitation   No tube feeds if cognitively unaware   Social Determinants of Health   Financial Resource Strain: Not on file  Food Insecurity: Not on file  Transportation Needs: Not on file  Physical Activity: Not on file  Stress: Not on file  Social Connections: Not on file  Intimate Partner Violence: Not on file   Review of Systems Also with lower abdominal and groin pain Has been moving rocks, bending to use chain saw, etc Also with increased pain around umbilicus    Objective:   Physical Exam Abdominal:     Palpations: Abdomen is soft.     Comments: No lower abdominal tenderness Has small swelling at umbilicus---and pain with bearing down  Musculoskeletal:     Comments: Nodule/tenderness at radial right wrist ROM okay  Tenderness over mid left 5th metatarsal  No joint swelling/pain Ankle and toes normal  Assessment & Plan:

## 2020-07-28 DIAGNOSIS — M5127 Other intervertebral disc displacement, lumbosacral region: Secondary | ICD-10-CM | POA: Diagnosis not present

## 2020-07-28 DIAGNOSIS — M9903 Segmental and somatic dysfunction of lumbar region: Secondary | ICD-10-CM | POA: Diagnosis not present

## 2020-07-28 DIAGNOSIS — M9904 Segmental and somatic dysfunction of sacral region: Secondary | ICD-10-CM | POA: Diagnosis not present

## 2020-07-28 DIAGNOSIS — M62830 Muscle spasm of back: Secondary | ICD-10-CM | POA: Diagnosis not present

## 2020-07-29 DIAGNOSIS — M9904 Segmental and somatic dysfunction of sacral region: Secondary | ICD-10-CM | POA: Diagnosis not present

## 2020-07-29 DIAGNOSIS — M5127 Other intervertebral disc displacement, lumbosacral region: Secondary | ICD-10-CM | POA: Diagnosis not present

## 2020-07-29 DIAGNOSIS — M9903 Segmental and somatic dysfunction of lumbar region: Secondary | ICD-10-CM | POA: Diagnosis not present

## 2020-07-29 DIAGNOSIS — M62830 Muscle spasm of back: Secondary | ICD-10-CM | POA: Diagnosis not present

## 2020-08-02 DIAGNOSIS — M62830 Muscle spasm of back: Secondary | ICD-10-CM | POA: Diagnosis not present

## 2020-08-02 DIAGNOSIS — M9903 Segmental and somatic dysfunction of lumbar region: Secondary | ICD-10-CM | POA: Diagnosis not present

## 2020-08-02 DIAGNOSIS — M5127 Other intervertebral disc displacement, lumbosacral region: Secondary | ICD-10-CM | POA: Diagnosis not present

## 2020-08-02 DIAGNOSIS — M9904 Segmental and somatic dysfunction of sacral region: Secondary | ICD-10-CM | POA: Diagnosis not present

## 2020-08-03 ENCOUNTER — Encounter: Payer: Self-pay | Admitting: Internal Medicine

## 2020-08-03 ENCOUNTER — Ambulatory Visit (INDEPENDENT_AMBULATORY_CARE_PROVIDER_SITE_OTHER): Payer: PPO | Admitting: Internal Medicine

## 2020-08-03 ENCOUNTER — Other Ambulatory Visit: Payer: Self-pay

## 2020-08-03 VITALS — BP 140/86 | HR 84 | Temp 97.3°F | Ht 66.25 in | Wt 131.0 lb

## 2020-08-03 DIAGNOSIS — Z7189 Other specified counseling: Secondary | ICD-10-CM

## 2020-08-03 DIAGNOSIS — M19041 Primary osteoarthritis, right hand: Secondary | ICD-10-CM

## 2020-08-03 DIAGNOSIS — K429 Umbilical hernia without obstruction or gangrene: Secondary | ICD-10-CM | POA: Diagnosis not present

## 2020-08-03 DIAGNOSIS — E538 Deficiency of other specified B group vitamins: Secondary | ICD-10-CM

## 2020-08-03 DIAGNOSIS — Z0000 Encounter for general adult medical examination without abnormal findings: Secondary | ICD-10-CM | POA: Diagnosis not present

## 2020-08-03 LAB — COMPREHENSIVE METABOLIC PANEL
ALT: 11 U/L (ref 0–35)
AST: 13 U/L (ref 0–37)
Albumin: 4.4 g/dL (ref 3.5–5.2)
Alkaline Phosphatase: 57 U/L (ref 39–117)
BUN: 22 mg/dL (ref 6–23)
CO2: 28 mEq/L (ref 19–32)
Calcium: 9.6 mg/dL (ref 8.4–10.5)
Chloride: 104 mEq/L (ref 96–112)
Creatinine, Ser: 0.84 mg/dL (ref 0.40–1.20)
GFR: 70.45 mL/min (ref >60.00)
Glucose, Bld: 90 mg/dL (ref 70–99)
Potassium: 4.2 mEq/L (ref 3.5–5.1)
Sodium: 141 mEq/L (ref 135–145)
Total Bilirubin: 0.7 mg/dL (ref 0.2–1.2)
Total Protein: 7 g/dL (ref 6.0–8.3)

## 2020-08-03 LAB — LIPID PANEL
Cholesterol: 134 mg/dL (ref 0–200)
HDL: 49.2 mg/dL (ref >39.00)
LDL Cholesterol: 72 mg/dL (ref 0–99)
NonHDL: 84.59
Total CHOL/HDL Ratio: 3
Triglycerides: 64 mg/dL (ref 0.0–149.0)
VLDL: 12.8 mg/dL (ref 0.0–40.0)

## 2020-08-03 LAB — CBC
HCT: 39.5 % (ref 36.0–46.0)
Hemoglobin: 13.5 g/dL (ref 12.0–15.0)
MCHC: 34.2 g/dL (ref 30.0–36.0)
MCV: 86.4 fl (ref 78.0–100.0)
Platelets: 198 10*3/uL (ref 150.0–400.0)
RBC: 4.56 Mil/uL (ref 3.87–5.11)
RDW: 13.1 % (ref 11.5–15.5)
WBC: 5.7 10*3/uL (ref 4.0–10.5)

## 2020-08-03 LAB — VITAMIN B12: Vitamin B-12: 996 pg/mL — ABNORMAL HIGH (ref 211–911)

## 2020-08-03 MED ORDER — CYCLOBENZAPRINE HCL 5 MG PO TABS: 5.0000 mg | ORAL_TABLET | Freq: Three times a day (TID) | ORAL | 0 refills | Status: DC | PRN

## 2020-08-03 NOTE — Assessment & Plan Note (Signed)
Still some pain but no major bulging now Will have Dr Bary Castilla review

## 2020-08-03 NOTE — Progress Notes (Signed)
Hearing Screening   125Hz  250Hz  500Hz  1000Hz  2000Hz  3000Hz  4000Hz  6000Hz  8000Hz   Right ear:           Left ear:           Comments: Has a hearing aid. Not wearing it today.   Vision Screening Comments: April 2021

## 2020-08-03 NOTE — Assessment & Plan Note (Signed)
Will check levels on supplements

## 2020-08-03 NOTE — Progress Notes (Signed)
Subjective:    Patient ID: Catherine Strickland, female    DOB: March 13, 1951, 70 y.o.   MRN: [id number]  HPI Here for Medicare wellness visit and follow up of chronic health conditions This visit occurred during the SARS-CoV-2 public health emergency.  Safety protocols were in place, including screening questions prior to the visit, additional usage of staff PPE, and extensive cleaning of exam room while observing appropriate contact time as indicated for disinfecting solutions.   Reviewed form and advanced directives Reviewed other doctors No alcohol or tobacco Walks regularly Vision is okay Has hearing aide--but just amplified and words not clarified (so not wearing it) Had fall when walking on trail. No injury No depression or anhedonia Independent with instrumental ADLs No worrisome memory issues  Ankle is better but her groin is worse (wondered about labral tear in hip) Saw chiropractor for increased back pains--disc disease Using ibuprofen and husband's flexeril (with some help)  Umbilical pain did worsen yesterday--more sore and tender Related to straining perhaps Did set up appt with Dr Bary Castilla  Eating keto diet with husband--he cooks He lost 50# on this She is worried about her cholesterol  On vitamin B12 orally still Some numbness in left arm---but improved after adjustment with chiropractor  Current Outpatient Medications on File Prior to Visit  Medication Sig Dispense Refill  . cholecalciferol (VITAMIN D) 1000 UNITS tablet Take 1,000 Units by mouth daily.    . Cyanocobalamin (VITAMIN B-12) 500 MCG SUBL Place 500 mcg under the tongue daily.     No current facility-administered medications on file prior to visit.    No Known Allergies  History reviewed. No pertinent past medical history.  Past Surgical History:  Procedure Laterality Date  . ABDOMINAL HYSTERECTOMY    . UTERINE FIBROID SURGERY    . VAGINAL DELIVERY     x5    Family History  Problem Relation  Age of Onset  . COPD Father   . Cancer Father        lung  . Cancer Mother        lung cancer  . Heart disease Neg Hx   . Breast cancer Neg Hx     Social History   Socioeconomic History  . Marital status: Married    Spouse name: Not on file  . Number of children: 5  . Years of education: Not on file  . Highest education level: Not on file  Occupational History  . 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 Occupation: Musician    Comment: church  Tobacco Use  . Smoking status: Never Smoker  . Smokeless tobacco: Never Used  Substance and Sexual Activity  . Alcohol use: No  . Drug use: No  . Sexual activity: Not on file  Other Topics Concern  . Not on file  Social History Narrative   Has living will   Husband is health care POA--alternate Sandrea Matte (daughter)   Would accept resuscitation   No tube feeds if cognitively unaware   Social Determinants of Health   Financial Resource Strain: Not on file  Food Insecurity: Not on file  Transportation Needs: Not on file  Physical Activity: Not on file  Stress: Not on file  Social Connections: Not on file  Intimate Partner Violence: Not on file   Review of Systems Appetite is fine Weight stable Sleeps well Wears seat belt Teeth okay---keeps up with dentist No suspicious skin lesions Occasional heartburn--baking soda works. No sig dysphagia Bowels  regular--no blood No chest pain or SOB No dizziness or syncope No edema    Objective:   Physical Exam Constitutional:      Appearance: Normal appearance.  HENT:     Mouth/Throat:     Comments: No lesions Eyes:     Conjunctiva/sclera: Conjunctivae normal.     Pupils: Pupils are equal, round, and reactive to light.  Cardiovascular:     Rate and Rhythm: Normal rate and regular rhythm.     Pulses: Normal pulses.     Heart sounds: No murmur heard. No gallop.   Pulmonary:     Effort: Pulmonary effort is normal.     Breath sounds: Normal breath  sounds. No wheezing or rales.  Abdominal:     Palpations: Abdomen is soft.     Tenderness: There is no abdominal tenderness.  Musculoskeletal:     Cervical back: Neck supple.     Right lower leg: No edema.     Left lower leg: No edema.  Lymphadenopathy:     Cervical: No cervical adenopathy.  Skin:    General: Skin is warm.     Findings: No rash.  Neurological:     Mental Status: She is alert.     Comments: President---"Biden, Trump, Obama" [phone] D-l-r-o-w Recall 3/3  Psychiatric:        Mood and Affect: Mood normal.        Behavior: Behavior normal.            Assessment & Plan:

## 2020-08-03 NOTE — Assessment & Plan Note (Signed)
I have personally reviewed the Medicare Annual Wellness questionnaire and have noted 1. The patient's medical and social history 2. Their use of alcohol, tobacco or illicit drugs 3. Their current medications and supplements 4. The patient's functional ability including ADL's, fall risks, home safety risks and hearing or visual             impairment. 5. Diet and physical activities 6. Evidence for depression or mood disorders  The patients weight, height, BMI and visual acuity have been recorded in the chart I have made referrals, counseling and provided education to the patient based review of the above and I have provided the pt with a written personalized care plan for preventive services.  I have provided you with a copy of your personalized plan for preventive services. Please take the time to review along with your updated medication list.  Recent negative colon--will probably have one last one in 5 years (family history) Mammogram tomorrow--continue every 1-2 years till at least 62 Usually exercises regularly Flu vaccine in the fall Consider COVID booster if surge again

## 2020-08-03 NOTE — Assessment & Plan Note (Signed)
[place] mostly Not that bad---not using Rx for now (did try diclofenac briefly)

## 2020-08-03 NOTE — Assessment & Plan Note (Signed)
See social history 

## 2020-08-04 ENCOUNTER — Ambulatory Visit
Admission: RE | Admit: 2020-08-04 | Discharge: 2020-08-04 | Disposition: A | Discharge status: Home / Self Care | Payer: PPO | Source: Ambulatory Visit | Attending: Internal Medicine | Admitting: Internal Medicine

## 2020-08-04 DIAGNOSIS — Z1231 Encounter for screening mammogram for malignant neoplasm of breast: Secondary | ICD-10-CM | POA: Insufficient documentation

## 2020-08-08 DIAGNOSIS — M9904 Segmental and somatic dysfunction of sacral region: Secondary | ICD-10-CM | POA: Diagnosis not present

## 2020-08-08 DIAGNOSIS — M5127 Other intervertebral disc displacement, lumbosacral region: Secondary | ICD-10-CM | POA: Diagnosis not present

## 2020-08-08 DIAGNOSIS — M9903 Segmental and somatic dysfunction of lumbar region: Secondary | ICD-10-CM | POA: Diagnosis not present

## 2020-08-08 DIAGNOSIS — M62830 Muscle spasm of back: Secondary | ICD-10-CM | POA: Diagnosis not present

## 2020-08-12 DIAGNOSIS — M9904 Segmental and somatic dysfunction of sacral region: Secondary | ICD-10-CM | POA: Diagnosis not present

## 2020-08-12 DIAGNOSIS — M5127 Other intervertebral disc displacement, lumbosacral region: Secondary | ICD-10-CM | POA: Diagnosis not present

## 2020-08-12 DIAGNOSIS — M62830 Muscle spasm of back: Secondary | ICD-10-CM | POA: Diagnosis not present

## 2020-08-12 DIAGNOSIS — M9903 Segmental and somatic dysfunction of lumbar region: Secondary | ICD-10-CM | POA: Diagnosis not present

## 2020-09-01 DIAGNOSIS — D1722 Benign lipomatous neoplasm of skin and subcutaneous tissue of left arm: Secondary | ICD-10-CM | POA: Diagnosis not present

## 2020-09-01 DIAGNOSIS — K429 Umbilical hernia without obstruction or gangrene: Secondary | ICD-10-CM | POA: Diagnosis not present

## 2020-09-19 DIAGNOSIS — M9901 Segmental and somatic dysfunction of cervical region: Secondary | ICD-10-CM | POA: Diagnosis not present

## 2020-09-19 DIAGNOSIS — M7918 Myalgia, other site: Secondary | ICD-10-CM | POA: Diagnosis not present

## 2020-09-19 DIAGNOSIS — M542 Cervicalgia: Secondary | ICD-10-CM | POA: Diagnosis not present

## 2020-09-29 ENCOUNTER — Other Ambulatory Visit: Payer: Self-pay | Admitting: General Surgery

## 2020-09-29 DIAGNOSIS — D1722 Benign lipomatous neoplasm of skin and subcutaneous tissue of left arm: Secondary | ICD-10-CM | POA: Diagnosis not present

## 2020-09-30 LAB — SURGICAL PATHOLOGY

## 2020-10-04 DIAGNOSIS — M7918 Myalgia, other site: Secondary | ICD-10-CM | POA: Diagnosis not present

## 2020-10-04 DIAGNOSIS — M542 Cervicalgia: Secondary | ICD-10-CM | POA: Diagnosis not present

## 2020-10-04 DIAGNOSIS — M9901 Segmental and somatic dysfunction of cervical region: Secondary | ICD-10-CM | POA: Diagnosis not present

## 2020-10-10 ENCOUNTER — Other Ambulatory Visit: Payer: Self-pay | Admitting: General Surgery

## 2020-10-10 NOTE — Progress Notes (Signed)
Subjective:     Patient ID: Catherine Strickland is a 70 y.o. female.  HPI  The following portions of the patient's history were reviewed and updated as appropriate.  This a new patient is here today for: office visit. She is here for evaluation of a umbilical hernia and a lipoma on her shoulder. She states the lipoma has been there for at least 3-4 years with no pain.She states it has gotten larger. She states she had umbilical pain one year ago that lasted several days. She states this happened again in March and it lasted for a week, described as a "pinching" pain. Prior to this most recent episode she was lifting rocks in her garden.   Review of Systems  Constitutional: Negative for chills and fever.  Respiratory: Negative for cough.        Chief Complaint  Patient presents with  . Hernia     BP 116/80   Pulse 78   Temp 36.4 C (97.6 F)   Ht 167.6 cm (5' 6" )   Wt 59 kg (130 lb)   SpO2 97%   BMI 20.98 kg/m   No past medical history on file.        Past Surgical History:  Procedure Laterality Date  . COLONOSCOPY  12/20/2000   Catherine Strickland (Sister)  . COLONOSCOPY  04/12/2004   Catherine Strickland (Sister)  . COLONOSCOPY  01/29/2013   Catherine Strickland (Sister) CBF 01/2018: Recall ltr mailed 12/13/17 (kj)  . COLONOSCOPY  02/12/2020   Normal examined colon/FHx CC/Repeat 39yr/TKT  . HYSTERECTOMY VAGINAL    . TRANSCERVICAL UTERINE FIBROID(S) ABLATION                OB History    Gravida  5   Para  4   Term      Preterm      AB      Living        SAB      IAB      Ectopic      Molar      Multiple      Live Births          Obstetric Comments  Age at first period 89Age of first pregnancy 89        Social History         Socioeconomic History  . Marital status: Married  Tobacco Use  . Smoking status: Never Smoker  . Smokeless tobacco: Never Used  Substance and Sexual Activity  . Alcohol use: Never  . Drug use: Never       No Known  Allergies  Current Medications        Current Outpatient Medications  Medication Sig Dispense Refill  . cholecalciferol (CHOLECALCIFEROL) 1000 unit tablet Take by mouth once daily    . cyanocobalamin, vitamin B-12, 500 mcg TbDL Place 1 tablet under the tongue once daily    . sod picosulf-mag ox-citric ac (PREPOPIK) 10 mg-3.5 gram-12 gram PwPk Take 2 packets by mouth as directed FOR COLON PREP (Patient not taking: Reported on 09/01/2020) 2 packet 0  . sodium, potassium, and magnesium (SUPREP) oral solution Take 1 Bottle by mouth as directed One kit contains 2 bottles.  Take both bottles at the times instructed by your provider. (Patient not taking: Reported on 09/01/2020) 354 mL 0  . triamcinolone 0.1 % cream as needed (Patient not taking: Reported on 09/01/2020)     No current facility-administered medications for this visit.  Family History  Problem Relation Age of Onset  . Lung cancer Mother   . Lung cancer Father   . COPD Father   . Colon cancer Sister 32       Deceased at 58  . Breast cancer Neg Hx         Objective:   Physical Exam Exam conducted with a chaperone present.  Constitutional:      Appearance: Normal appearance.  Cardiovascular:     Rate and Rhythm: Normal rate and regular rhythm.     Pulses: Normal pulses.     Heart sounds: Normal heart sounds.  Pulmonary:     Effort: Pulmonary effort is normal.     Breath sounds: Normal breath sounds.  Chest:    Abdominal:    Musculoskeletal:     Cervical back: Neck supple.  Skin:    General: Skin is warm and dry.  Neurological:     Mental Status: She is alert and oriented to person, place, and time.  Psychiatric:        Mood and Affect: Mood normal.        Behavior: Behavior normal.        Assessment:     Umbilical hernia with focal discomfort on 2 occasions over the past year.,  Presently asymptomatic.  Large left anterior shoulder lipoma.    Plan:     The lipoma is  certainly a size warranting elective excision.  This can be completed under local anesthesia if she elects to defer repair of the umbilical hernia.  The patient will notify the office of how she would like to proceed.     This note is partially prepared by Catherine Fetch, RN, acting as a scribe in the presence of Dr. Hervey Ard, MD.  The documentation recorded by the scribe accurately reflects the service I personally performed and the decisions made by me.   Catherine Bellow, MD FACS

## 2020-10-12 ENCOUNTER — Other Ambulatory Visit
Admission: RE | Admit: 2020-10-12 | Discharge: 2020-10-12 | Disposition: A | Discharge status: Home / Self Care | Payer: PPO | Source: Ambulatory Visit | Attending: General Surgery | Admitting: General Surgery

## 2020-10-12 ENCOUNTER — Other Ambulatory Visit: Payer: Self-pay

## 2020-10-12 NOTE — Patient Instructions (Signed)
Your procedure is scheduled on: Monday October 17, 2020. Report to Day Surgery inside [facility] 2nd floor (stop by admissions desk first before getting on elevator). To find out your arrival time please call [phone] between 1PM - 3PM on Friday October 14, 2020.  Remember: Instructions that are not followed completely may result in serious medical risk,  up to and including death, or upon the discretion of your surgeon and anesthesiologist your  surgery may need to be rescheduled.     _X__ 1. Do not eat food after midnight the night before your procedure.                 No chewing gum or hard candies. You may drink clear liquids up to 2 hours                 before you are scheduled to arrive for your surgery- DO not drink clear                 liquids within 2 hours of the start of your surgery.                 Clear Liquids include:  water, apple juice without pulp, clear Gatorade, G2 or                  Gatorade Zero (avoid Red/Purple/Blue), Black Coffee or Tea (Do not add                 anything to coffee or tea).  __X__2.  On the morning of surgery brush your teeth with toothpaste and water, you                may rinse your mouth with mouthwash if you wish.  Do not swallow any toothpaste of mouthwash.     _X__ 3.  No Alcohol for 24 hours before or after surgery.   _X__ 4.  Do Not Smoke or use e-cigarettes For 24 Hours Prior to Your Surgery.                 Do not use any chewable tobacco products for at least 6 hours prior to                 Surgery.  _X__  5.  Do not use any recreational drugs (marijuana, cocaine, heroin, ecstasy, MDMA or other)                For at least one week prior to your surgery.  Combination of these drugs with anesthesia                May have life threatening results..   __X__ 6.  Notify your doctor if there is any change in your medical condition      (cold, fever, infections).     Do not wear jewelry, make-up, hairpins,  clips or nail polish. Do not wear lotions, powders, or perfumes. You may wear deodorant. Do not shave 48 hours prior to surgery.  Do not bring valuables to the hospital.    [facility] is not responsible for any belongings or valuables.  Contacts, dentures or bridgework may not be worn into surgery. Leave your suitcase in the car. After surgery it may be brought to your room. For patients admitted to the hospital, discharge time is determined by your treatment team.   Patients discharged the day of surgery will not be allowed to drive home.   Make  arrangements for someone to be with you for the first 24 hours of your Same Day Discharge.   __X__ Take these medicines the morning of surgery with A SIP OF WATER:    1. None   2.   3.   4.  5.  6.  ____ Fleet Enema (as directed)   _X__ Use CHG Soap (or wipes) as directed  ____ Use Benzoyl Peroxide Gel as instructed  ____ Use inhalers on the day of surgery  ____ Stop metformin 2 days prior to surgery    ____ Take 1/2 of usual insulin dose the night before surgery. No insulin the morning          of surgery.   ____ Call your PCP, cardiologist, or Pulmonologist if taking Coumadin/Plavix/aspirin and ask when to stop before your surgery.   __X__ One Week prior to surgery- Stop Anti-inflammatories such as Ibuprofen, Aleve, Advil, Motrin, meloxicam (MOBIC), diclofenac, etodolac, ketorolac, Toradol, Daypro, piroxicam, Goody's or BC powders. OK TO USE TYLENOL IF NEEDED   __X__ Stop supplements until after surgery.    ____ Bring C-Pap to the hospital.    If you have any questions regarding your pre-procedure instructions,  Please call Pre-admit Testing at [phone].

## 2020-10-13 ENCOUNTER — Other Ambulatory Visit
Admission: RE | Admit: 2020-10-13 | Discharge: 2020-10-13 | Disposition: A | Discharge status: Home / Self Care | Payer: PPO | Source: Ambulatory Visit | Attending: General Surgery | Admitting: General Surgery

## 2020-10-13 ENCOUNTER — Other Ambulatory Visit: Payer: Self-pay

## 2020-10-13 DIAGNOSIS — Z01810 Encounter for preprocedural cardiovascular examination: Secondary | ICD-10-CM | POA: Insufficient documentation

## 2020-10-13 DIAGNOSIS — Z01818 Encounter for other preprocedural examination: Secondary | ICD-10-CM | POA: Diagnosis not present

## 2020-10-16 MED ORDER — CHLORHEXIDINE GLUCONATE 0.12 % MT SOLN: 15.0000 mL | Freq: Once | OROMUCOSAL | Status: DC

## 2020-10-16 MED ORDER — FAMOTIDINE 20 MG PO TABS: 20.0000 mg | ORAL_TABLET | Freq: Once | ORAL | Status: AC

## 2020-10-16 MED ORDER — ORAL CARE MOUTH RINSE: 15.0000 mL | Freq: Once | OROMUCOSAL | Status: DC

## 2020-10-16 MED ORDER — LACTATED RINGERS IV SOLN: INTRAVENOUS | Status: DC

## 2020-10-16 MED ORDER — CHLORHEXIDINE GLUCONATE CLOTH 2 % EX PADS: 6.0000 | MEDICATED_PAD | Freq: Once | CUTANEOUS | Status: DC

## 2020-10-16 MED ORDER — CEFAZOLIN SODIUM-DEXTROSE 2-4 GM/100ML-% IV SOLN
2.0000 g | INTRAVENOUS | Status: AC
  Administered 2020-10-17: 2 g via INTRAVENOUS

## 2020-10-17 ENCOUNTER — Encounter: Payer: Self-pay | Admitting: General Surgery

## 2020-10-17 ENCOUNTER — Ambulatory Visit: Payer: PPO | Admitting: Urgent Care

## 2020-10-17 ENCOUNTER — Other Ambulatory Visit: Payer: Self-pay

## 2020-10-17 ENCOUNTER — Ambulatory Visit
Admission: RE | Admit: 2020-10-17 | Discharge: 2020-10-17 | Disposition: A | Discharge status: Home / Self Care | Payer: PPO | Attending: General Surgery | Admitting: General Surgery

## 2020-10-17 ENCOUNTER — Encounter
Admission: RE | Disposition: A | Discharge status: Home / Self Care | Payer: Self-pay | Source: Home / Self Care | Attending: General Surgery

## 2020-10-17 ENCOUNTER — Ambulatory Visit: Payer: PPO

## 2020-10-17 DIAGNOSIS — K429 Umbilical hernia without obstruction or gangrene: Secondary | ICD-10-CM | POA: Diagnosis not present

## 2020-10-17 DIAGNOSIS — Z90710 Acquired absence of both cervix and uterus: Secondary | ICD-10-CM | POA: Insufficient documentation

## 2020-10-17 HISTORY — PX: UMBILICAL HERNIA REPAIR: SHX196

## 2020-10-17 SURGERY — REPAIR, HERNIA, UMBILICAL, ADULT
Anesthesia: General

## 2020-10-17 MED ORDER — HYDROCODONE-ACETAMINOPHEN 5-325 MG PO TABS
1.0000 | ORAL_TABLET | Freq: Once | ORAL | Status: AC
  Administered 2020-10-17: 1 via ORAL

## 2020-10-17 MED ORDER — FAMOTIDINE 20 MG PO TABS
ORAL_TABLET | ORAL | Status: AC
  Administered 2020-10-17: 20 mg via ORAL
  Filled 2020-10-17: qty 1

## 2020-10-17 MED ORDER — HYDROCODONE-ACETAMINOPHEN 5-325 MG PO TABS
ORAL_TABLET | ORAL | Status: AC
  Filled 2020-10-17: qty 1

## 2020-10-17 MED ORDER — FENTANYL CITRATE (PF) 100 MCG/2ML IJ SOLN
INTRAMUSCULAR | Status: DC | PRN
  Administered 2020-10-17: 50 ug via INTRAVENOUS
  Administered 2020-10-17 (×2): 25 ug via INTRAVENOUS

## 2020-10-17 MED ORDER — PROPOFOL 10 MG/ML IV BOLUS
INTRAVENOUS | Status: AC
  Filled 2020-10-17: qty 60

## 2020-10-17 MED ORDER — ONDANSETRON HCL 4 MG/2ML IJ SOLN: 4.0000 mg | Freq: Once | INTRAMUSCULAR | Status: DC | PRN

## 2020-10-17 MED ORDER — KETOROLAC TROMETHAMINE 30 MG/ML IJ SOLN
INTRAMUSCULAR | Status: AC
  Filled 2020-10-17: qty 1

## 2020-10-17 MED ORDER — FENTANYL CITRATE (PF) 100 MCG/2ML IJ SOLN
INTRAMUSCULAR | Status: AC
  Filled 2020-10-17: qty 2

## 2020-10-17 MED ORDER — DEXAMETHASONE SODIUM PHOSPHATE 10 MG/ML IJ SOLN
INTRAMUSCULAR | Status: AC
  Filled 2020-10-17: qty 1

## 2020-10-17 MED ORDER — DEXAMETHASONE SODIUM PHOSPHATE 10 MG/ML IJ SOLN
INTRAMUSCULAR | Status: DC | PRN
  Administered 2020-10-17: 10 mg via INTRAVENOUS

## 2020-10-17 MED ORDER — BUPIVACAINE HCL 0.5 % IJ SOLN
INTRAMUSCULAR | Status: DC | PRN
  Administered 2020-10-17: 30 mL

## 2020-10-17 MED ORDER — HYDROCODONE-ACETAMINOPHEN 5-325 MG PO TABS: 1.0000 | ORAL_TABLET | ORAL | 0 refills | Status: DC | PRN

## 2020-10-17 MED ORDER — LIDOCAINE HCL (PF) 2 % IJ SOLN
INTRAMUSCULAR | Status: AC
  Filled 2020-10-17: qty 5

## 2020-10-17 MED ORDER — PROPOFOL 10 MG/ML IV BOLUS
INTRAVENOUS | Status: DC | PRN
  Administered 2020-10-17: 120 mg via INTRAVENOUS

## 2020-10-17 MED ORDER — CHLORHEXIDINE GLUCONATE 0.12 % MT SOLN
OROMUCOSAL | Status: AC
  Filled 2020-10-17: qty 15

## 2020-10-17 MED ORDER — EPHEDRINE SULFATE 50 MG/ML IJ SOLN
INTRAMUSCULAR | Status: DC | PRN
  Administered 2020-10-17 (×2): 5 mg via INTRAVENOUS

## 2020-10-17 MED ORDER — ROCURONIUM BROMIDE 10 MG/ML (PF) SYRINGE
PREFILLED_SYRINGE | INTRAVENOUS | Status: AC
  Filled 2020-10-17: qty 10

## 2020-10-17 MED ORDER — MIDAZOLAM HCL 2 MG/2ML IJ SOLN
INTRAMUSCULAR | Status: DC | PRN
  Administered 2020-10-17: 2 mg via INTRAVENOUS

## 2020-10-17 MED ORDER — LIDOCAINE HCL (CARDIAC) PF 100 MG/5ML IV SOSY
PREFILLED_SYRINGE | INTRAVENOUS | Status: DC | PRN
  Administered 2020-10-17: 60 mg via INTRAVENOUS

## 2020-10-17 MED ORDER — MIDAZOLAM HCL 2 MG/2ML IJ SOLN
INTRAMUSCULAR | Status: AC
  Filled 2020-10-17: qty 2

## 2020-10-17 MED ORDER — FENTANYL CITRATE (PF) 100 MCG/2ML IJ SOLN: 25.0000 ug | INTRAMUSCULAR | Status: DC | PRN

## 2020-10-17 MED ORDER — KETOROLAC TROMETHAMINE 30 MG/ML IJ SOLN
INTRAMUSCULAR | Status: DC | PRN
  Administered 2020-10-17: 30 mg via INTRAVENOUS

## 2020-10-17 MED ORDER — CEFAZOLIN SODIUM-DEXTROSE 2-4 GM/100ML-% IV SOLN
INTRAVENOUS | Status: AC
  Filled 2020-10-17: qty 100

## 2020-10-17 MED ORDER — ONDANSETRON HCL 4 MG/2ML IJ SOLN
INTRAMUSCULAR | Status: DC | PRN
  Administered 2020-10-17: 4 mg via INTRAVENOUS

## 2020-10-17 MED ORDER — ACETAMINOPHEN 10 MG/ML IV SOLN
INTRAVENOUS | Status: DC | PRN
  Administered 2020-10-17: 1000 mg via INTRAVENOUS

## 2020-10-17 MED ORDER — ACETAMINOPHEN 10 MG/ML IV SOLN
INTRAVENOUS | Status: AC
  Filled 2020-10-17: qty 100

## 2020-10-17 MED ORDER — ONDANSETRON HCL 4 MG/2ML IJ SOLN
INTRAMUSCULAR | Status: AC
  Filled 2020-10-17: qty 2

## 2020-10-17 SURGICAL SUPPLY — 33 items
APL PRP STRL LF DISP 70% ISPRP (MISCELLANEOUS) ×1
BLADE SURG 15 STRL SS SAFETY (BLADE) ×3 IMPLANT
CANISTER SUCT 1200ML W/VALVE (MISCELLANEOUS) ×3 IMPLANT
CHLORAPREP W/TINT 26 (MISCELLANEOUS) ×3 IMPLANT
CLOSURE WOUND 1/2 X4 (GAUZE/BANDAGES/DRESSINGS) ×1
COVER WAND RF STERILE (DRAPES) ×3 IMPLANT
DRAPE LAPAROTOMY 100X77 ABD (DRAPES) ×3 IMPLANT
DRSG TEGADERM 4X4.75 (GAUZE/BANDAGES/DRESSINGS) ×3 IMPLANT
DRSG TELFA 4X3 1S NADH ST (GAUZE/BANDAGES/DRESSINGS) ×3 IMPLANT
ELECT REM PT RETURN 9FT ADLT (ELECTROSURGICAL) ×3
ELECTRODE REM PT RTRN 9FT ADLT (ELECTROSURGICAL) ×1 IMPLANT
GLOVE SURG ENC MOIS LTX SZ7.5 (GLOVE) ×3 IMPLANT
GLOVE SURG UNDER LTX SZ8 (GLOVE) ×3 IMPLANT
GOWN STRL REUS W/ TWL LRG LVL3 (GOWN DISPOSABLE) ×2 IMPLANT
GOWN STRL REUS W/TWL LRG LVL3 (GOWN DISPOSABLE) ×6
KIT TURNOVER KIT A (KITS) ×3 IMPLANT
LABEL OR SOLS (LABEL) ×3 IMPLANT
MANIFOLD NEPTUNE II (INSTRUMENTS) ×3 IMPLANT
MESH VENTRALEX ST 2.5 CRC MED (Mesh General) ×3 IMPLANT
NEEDLE HYPO 22GX1.5 SAFETY (NEEDLE) ×6 IMPLANT
NEEDLE HYPO 25X1 1.5 SAFETY (NEEDLE) ×3 IMPLANT
NS IRRIG 500ML POUR BTL (IV SOLUTION) ×3 IMPLANT
PACK BASIN MINOR ARMC (MISCELLANEOUS) ×3 IMPLANT
STRIP CLOSURE SKIN 1/2X4 (GAUZE/BANDAGES/DRESSINGS) ×2 IMPLANT
SUT SURGILON 0 BLK (SUTURE) ×6 IMPLANT
SUT VIC AB 3-0 54X BRD REEL (SUTURE) ×2 IMPLANT
SUT VIC AB 3-0 BRD 54 (SUTURE) ×6
SUT VIC AB 3-0 SH 27 (SUTURE) ×3
SUT VIC AB 3-0 SH 27X BRD (SUTURE) ×1 IMPLANT
SUT VIC AB 4-0 FS2 27 (SUTURE) ×3 IMPLANT
SWABSTK COMLB BENZOIN TINCTURE (MISCELLANEOUS) ×3 IMPLANT
SYR 10ML LL (SYRINGE) ×3 IMPLANT
SYR 3ML LL SCALE MARK (SYRINGE) ×3 IMPLANT

## 2020-10-17 NOTE — Op Note (Signed)
Preoperative diagnosis: Symptomatic umbilical hernia.  Postoperative diagnosis: Same.  Operative procedure: Umbilical hernia repair with 6.4 cm Ventralex mesh.  Operating surgeon: Hervey Ard, MD.  Anesthesia: General by LMA, Marcaine 0.5%, plain, 30 cc local infiltration.  Estimated blood loss: 2 cc.  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  She was admitted for elective repair.  Clinically this was thought to be about a 5 mm defect.  Role of prosthetic mesh of a larger hernia was identified was reviewed with the patient prior to the procedure.  She had Ancef for preoperative antibiotics.  SCD stockings for DVT prevention.  Operative note: With the patient under adequate general anesthesia the abdomen was cleansed with ChloraPrep and draped.  Field block anesthesia was made with Marcaine.  An infraumbilical incision was made.  The skin was incised sharply and the remaining dissection of the umbilical skin off the hernia sac was completed with a knife.  The hernia sac was freed circumferentially divided with electrocautery excised and discarded as was a small patch of preperitoneal fat.  The undersurface of the fascia was cleared circumferentially.  No epigastric defect was noted.  Based on her strenuous activities at home and the fact that the defect was approximately 2 cm in diameter with wise, 5 mm in length it was elected to make use of a mesh reinforcement.  This was tacked transvaginally circumferentially with interrupted 0 Surgilon sutures.  The fascia was closed transversely with interrupted 0 Surgilon sutures taking a "bite" of the mesh with each suture placement.  These were then tied sequentially.  The umbilical skin was tacked to the fascia with 3-0 Vicryl figure-of-eight suture.  The adipose layer was approximated with a running 3-0 Vicryl suture.  The skin closed with a running 4-0 Vicryl  subcuticular suture.  Benzoin, Steri-Strips, Telfa and Tegaderm dressing was applied.  The patient tolerated the procedure well and was taken recovery in stable condition.

## 2020-10-17 NOTE — H&P (Signed)
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  Usually, tender at site and to the right of the midline, recently to the left of the midline as well.  Anticipate primary repair unless defect is significantly larger than clinically evident.   Medications Prior to Admission  Medication Sig Dispense Refill Last Dose   cholecalciferol (VITAMIN D) 1000 UNITS tablet Take 1,000 Units by mouth daily.   10/12/2020   Cyanocobalamin (VITAMIN B-12) 500 MCG SUBL Place 500 mcg under the tongue daily.   10/12/2020   cyclobenzaprine (FLEXERIL) 5 MG tablet Take 1 tablet (5 mg total) by mouth 3 (three) times daily as needed for muscle spasms. (Patient not taking: No sig reported) 30 tablet 0 Not Taking at Unknown time   ferrous sulfate 324 MG TBEC Take 324 mg by mouth.   10/12/2020   No Known Allergies History reviewed. No pertinent past medical history. Past Surgical History:  Procedure Laterality Date   ABDOMINAL HYSTERECTOMY     UTERINE FIBROID SURGERY     VAGINAL DELIVERY     x5   Social History   Socioeconomic History   Marital status: Married    Spouse name: Not on file   Number of children: 5   Years of education: Not on file   Highest education level: Not on file  Occupational History   Occupation: Radiation protection practitioner    Comment: [employer]   Occupation: Musician    Comment: church  Tobacco Use   Smoking status: Never   Smokeless tobacco: Never  Scientific laboratory technician Use: Never used  Substance and Sexual Activity   Alcohol use: No   Drug use: No   Sexual activity: Yes  Other Topics Concern   Not on file  Social History Narrative   Has living will   Husband is health care POA--alternate Sandrea Matte (daughter)   Would accept resuscitation   No tube feeds if cognitively unaware   Social Determinants of Health   Financial Resource Strain: Not on file  Food Insecurity:  Not on file  Transportation Needs: Not on file  Physical Activity: Not on file  Stress: Not on file  Social Connections: Not on file  Intimate Partner Violence: Not on file   Social History   Social History Narrative   Has living will   Husband is health care POA--alternate Sandrea Matte (daughter)   Would accept resuscitation   No tube feeds if cognitively unaware     ROS: Negative.     PE: HEENT: Negative. Lungs: Clear. Cardio: RR. Abd: Small defect at umbilicus with reducible tissue. Tender with pressure. No secondary defects noted.    Assessment/Plan:  Proceed with planned umbilical hernia repair.    Forest Gleason [facility] 10/17/2020

## 2020-10-17 NOTE — Anesthesia Procedure Notes (Addendum)
Procedure Name: LMA Insertion Date/Time: 10/17/2020 9:57 AM Performed by: Biagio Borg, CRNA Pre-anesthesia Checklist: Patient identified, Emergency Drugs available, Suction available, Patient being monitored and Timeout performed Patient Re-evaluated:Patient Re-evaluated prior to induction Oxygen Delivery Method: Circle system utilized Preoxygenation: Pre-oxygenation with 100% oxygen Induction Type: IV induction Ventilation: Mask ventilation without difficulty LMA: LMA inserted LMA Size: 4.0 Number of attempts: 1 Tube secured with: Tape

## 2020-10-17 NOTE — Transfer of Care (Signed)
Immediate Anesthesia Transfer of Care Note  Patient: Catherine Strickland  Procedure(s) Performed: HERNIA REPAIR UMBILICAL ADULT  Patient Location: PACU  Anesthesia Type:General  Level of Consciousness: drowsy  Airway & Oxygen Therapy: Patient connected to face mask oxygen  Post-op Assessment: Post -op Vital signs reviewed and stable  Post vital signs: stable  Last Vitals:  Vitals Value Taken Time  BP 102/53 10/17/20 1045  Temp    Pulse 60 10/17/20 1049  Resp 15 10/17/20 1049  SpO2 99 % 10/17/20 1049  Vitals shown include unvalidated device data.  Last Pain:  Vitals:   10/17/20 0827  TempSrc: Oral  PainSc: 0-No pain         Complications: No notable events documented.

## 2020-10-17 NOTE — Discharge Instructions (Signed)
AMBULATORY SURGERY  ?DISCHARGE INSTRUCTIONS ? ? ?The drugs that you were given will stay in your system until tomorrow so for the next 24 hours you should not: ? ?Drive an automobile ?Make any legal decisions ?Drink any alcoholic beverage ? ? ?You may resume regular meals tomorrow.  Today it is better to start with liquids and gradually work up to solid foods. ? ?You may eat anything you prefer, but it is better to start with liquids, then soup and crackers, and gradually work up to solid foods. ? ? ?Please notify your doctor immediately if you have any unusual bleeding, trouble breathing, redness and pain at the surgery site, drainage, fever, or pain not relieved by medication. ? ? ? ?Additional Instructions: ? ? ? ?Please contact your physician with any problems or Same Day Surgery at [phone], Monday through Friday 6 am to 4 pm, or [place] [facility] [place] Main number at [phone].  ?

## 2020-10-17 NOTE — Anesthesia Preprocedure Evaluation (Signed)
Anesthesia Evaluation  Patient identified by MRN, date of birth, ID band Patient awake    Reviewed: Allergy & Precautions, NPO status , Patient's Chart, lab work & pertinent test results  History of Anesthesia Complications Negative for: history of anesthetic complications  Airway Mallampati: II       Dental   Pulmonary neg sleep apnea, neg COPD, Not current smoker,           Cardiovascular (-) hypertension(-) Past MI and (-) CHF (-) dysrhythmias (-) Valvular Problems/Murmurs     Neuro/Psych neg Seizures    GI/Hepatic Neg liver ROS, neg GERD  ,  Endo/Other  neg diabetes  Renal/GU negative Renal ROS     Musculoskeletal   Abdominal   Peds  Hematology   Anesthesia Other Findings   Reproductive/Obstetrics                             Anesthesia Physical Anesthesia Plan  ASA: 2  Anesthesia Plan: General   Post-op Pain Management:    Induction: Intravenous  PONV Risk Score and Plan: 3 and Ondansetron, Dexamethasone and Treatment may vary due to age or medical condition  Airway Management Planned: Oral ETT  Additional Equipment:   Intra-op Plan:   Post-operative Plan:   Informed Consent: I have reviewed the patients History and Physical, chart, labs and discussed the procedure including the risks, benefits and alternatives for the proposed anesthesia with the patient or authorized representative who has indicated his/her understanding and acceptance.       Plan Discussed with:   Anesthesia Plan Comments:         Anesthesia Quick Evaluation

## 2020-10-17 NOTE — Anesthesia Postprocedure Evaluation (Signed)
Anesthesia Post Note  Patient: Catherine Strickland  Procedure(s) Performed: HERNIA REPAIR UMBILICAL ADULT  Patient location during evaluation: PACU Anesthesia Type: General Level of consciousness: awake and alert Pain management: pain level controlled Vital Signs Assessment: post-procedure vital signs reviewed and stable Respiratory status: spontaneous breathing and respiratory function stable Cardiovascular status: stable Anesthetic complications: no   No notable events documented.   Last Vitals:  Vitals:   10/17/20 1047 10/17/20 1100  BP: (!) 102/53 117/61  Pulse: 63 (!) 59  Resp: 14 15  Temp: (!) 36.2 C   SpO2: 100% 100%    Last Pain:  Vitals:   10/17/20 1047  TempSrc:   PainSc: 0-No pain                 Urania Pearlman K

## 2020-11-23 DIAGNOSIS — M2140 Flat foot [pes planus] (acquired), unspecified foot: Secondary | ICD-10-CM | POA: Diagnosis not present

## 2020-11-23 DIAGNOSIS — M189 Osteoarthritis of first carpometacarpal joint, unspecified: Secondary | ICD-10-CM | POA: Diagnosis not present

## 2021-06-08 DIAGNOSIS — H52223 Regular astigmatism, bilateral: Secondary | ICD-10-CM | POA: Diagnosis not present

## 2021-06-08 DIAGNOSIS — H524 Presbyopia: Secondary | ICD-10-CM | POA: Diagnosis not present

## 2021-06-08 DIAGNOSIS — H5213 Myopia, bilateral: Secondary | ICD-10-CM | POA: Diagnosis not present

## 2021-06-08 DIAGNOSIS — H35373 Puckering of macula, bilateral: Secondary | ICD-10-CM | POA: Diagnosis not present

## 2021-08-08 ENCOUNTER — Ambulatory Visit (INDEPENDENT_AMBULATORY_CARE_PROVIDER_SITE_OTHER): Payer: PPO | Admitting: Internal Medicine

## 2021-08-08 ENCOUNTER — Encounter: Payer: Self-pay | Admitting: Internal Medicine

## 2021-08-08 VITALS — BP 132/82 | HR 62 | Temp 97.6°F | Ht 65.75 in | Wt 131.0 lb

## 2021-08-08 DIAGNOSIS — M19041 Primary osteoarthritis, right hand: Secondary | ICD-10-CM

## 2021-08-08 DIAGNOSIS — E538 Deficiency of other specified B group vitamins: Secondary | ICD-10-CM | POA: Diagnosis not present

## 2021-08-08 DIAGNOSIS — M19042 Primary osteoarthritis, left hand: Secondary | ICD-10-CM

## 2021-08-08 DIAGNOSIS — Z7189 Other specified counseling: Secondary | ICD-10-CM | POA: Diagnosis not present

## 2021-08-08 DIAGNOSIS — Z0000 Encounter for general adult medical examination without abnormal findings: Secondary | ICD-10-CM

## 2021-08-08 LAB — COMPREHENSIVE METABOLIC PANEL
ALT: 11 U/L (ref 0–35)
AST: 17 U/L (ref 0–37)
Albumin: 4.4 g/dL (ref 3.5–5.2)
Alkaline Phosphatase: 60 U/L (ref 39–117)
BUN: 15 mg/dL (ref 6–23)
CO2: 23 mEq/L (ref 19–32)
Calcium: 9.2 mg/dL (ref 8.4–10.5)
Chloride: 113 mEq/L — ABNORMAL HIGH (ref 96–112)
Creatinine, Ser: 1.01 mg/dL (ref 0.40–1.20)
GFR: 56.07 mL/min — ABNORMAL LOW (ref >60.00)
Glucose, Bld: 83 mg/dL (ref 70–99)
Potassium: 4.4 mEq/L (ref 3.5–5.1)
Sodium: 142 mEq/L (ref 135–145)
Total Bilirubin: 0.5 mg/dL (ref 0.2–1.2)
Total Protein: 6.6 g/dL (ref 6.0–8.3)

## 2021-08-08 LAB — LIPID PANEL
Cholesterol: 124 mg/dL (ref 0–200)
HDL: 52.6 mg/dL (ref >39.00)
LDL Cholesterol: 60 mg/dL (ref 0–99)
NonHDL: 71.23
Total CHOL/HDL Ratio: 2
Triglycerides: 56 mg/dL (ref 0.0–149.0)
VLDL: 11.2 mg/dL (ref 0.0–40.0)

## 2021-08-08 LAB — CBC
HCT: 39.4 % (ref 36.0–46.0)
Hemoglobin: 13.1 g/dL (ref 12.0–15.0)
MCHC: 33.3 g/dL (ref 30.0–36.0)
MCV: 88.6 fl (ref 78.0–100.0)
Platelets: 194 10*3/uL (ref 150.0–400.0)
RBC: 4.45 Mil/uL (ref 3.87–5.11)
RDW: 13.5 % (ref 11.5–15.5)
WBC: 4.9 10*3/uL (ref 4.0–10.5)

## 2021-08-08 LAB — VITAMIN B12: Vitamin B-12: 742 pg/mL (ref 211–911)

## 2021-08-08 NOTE — Assessment & Plan Note (Signed)
I have personally reviewed the Medicare Annual Wellness questionnaire and have noted ?1. The patient's medical and social history ?2. Their use of alcohol, tobacco or illicit drugs ?3. Their current medications and supplements ?4. The patient's functional ability including ADL's, fall risks, home safety risks and hearing or visual ?            impairment. ?5. Diet and physical activities ?6. Evidence for depression or mood disorders ? ?The patients weight, height, BMI and visual acuity have been recorded in the chart ?I have made referrals, counseling and provided education to the patient based review of the above and I have provided the pt with a written personalized care plan for preventive services. ? ?I have provided you with a copy of your personalized plan for preventive services. Please take the time to review along with your updated medication list. ? ?Healthy ?Exercises regularly ?Colon due again 2026---sister died of colon cancer (negative 2021) ?Mammogram within 2 years till 74----just done 1 year ago ?No pap due to hyster ?Flu vaccine in the fall----COVID booster if recommended ? ?

## 2021-08-08 NOTE — Assessment & Plan Note (Signed)
Doing okay on the daily OTC supplements ?Will check levels ?

## 2021-08-08 NOTE — Progress Notes (Signed)
? ?Subjective:  ? ? Patient ID: Catherine Strickland, female    DOB: 12/16/1950, 71 y.o.   MRN: [id number] ? ?HPI ?Here for Medicare wellness visit and follow up of chronic health conditions ?Reviewed form and advanced directives ?Reviewed other doctors ?Vision is okay ?Hearing is not great--went for a new hearing aide--didn't help ?No tobacco or alcohol ?1 fall--no injury ?No depression or anhedonia ?Does walk regularly ?Independent with instrumental ADLs ?No sig memory issues ? ?Had the umbilical hernia repaired ?The recovery has been slower than expected---still not normal yet ?Has tried physical therapist---to work on muscle strength and proper lifting ?Is able to use small chain saw--or hedge trimmers---but limited time ? ?Mild hand pain is better---no recent med needs ?No ankle or other joint issues now ?Had been taking some iron---but sporadic ? ?Continues on B12 ?No hand or foot numbness or tingling ? ?Current Outpatient Medications on File Prior to Visit  ?Medication Sig Dispense Refill  ? cholecalciferol (VITAMIN D) 1000 UNITS tablet Take 1,000 Units by mouth daily.    ? Cyanocobalamin (VITAMIN B-12) 500 MCG SUBL Place 500 mcg under the tongue daily.    ? ?No current facility-administered medications on file prior to visit.  ? ? ?No Known Allergies ? ?History reviewed. No pertinent past medical history. ? ?Past Surgical History:  ?Procedure Laterality Date  ? ABDOMINAL HYSTERECTOMY    ? UMBILICAL HERNIA REPAIR N/A 10/17/2020  ? Procedure: HERNIA REPAIR UMBILICAL ADULT;  Surgeon: Robert Bellow, MD;  Location: [facility] ORS;  Service: General;  Laterality: N/A;  open umbilical hernia repair  ? UTERINE FIBROID SURGERY    ? VAGINAL DELIVERY    ? x5  ? ? ?Family History  ?Problem Relation Age of Onset  ? COPD Father   ? Cancer Father   ?     lung  ? Cancer Mother   ?     lung cancer  ? Heart disease Neg Hx   ? Breast cancer Neg Hx   ? ? ?Social History  ? ?Socioeconomic History  ? Marital status: Married  ?  Spouse  name: Not on file  ? Number of children: 5  ? Years of education: Not on file  ? Highest education level: Not on file  ?Occupational History  ? Occupation: Radiation protection practitioner  ?  Comment: [employer]  ? 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 Smoking status: Never  ?  Passive exposure: Past  ? Smokeless tobacco: Never  ?Vaping Use  ? Vaping Use: Never used  ?Substance and Sexual Activity  ? Alcohol use: No  ? Drug use: No  ? Sexual activity: Yes  ?Other Topics Concern  ? Not on file  ?Social History Narrative  ? Has living will  ? Husband is health care POA--alternate Sandrea Matte (daughter)  ? Would accept resuscitation  ? No tube feeds if cognitively unaware  ? ?Social Determinants of Health  ? ?Financial Resource Strain: Not on file  ?Food Insecurity: Not on file  ?Transportation Needs: Not on file  ?Physical Activity: Not on file  ?Stress: Not on file  ?Social Connections: Not on file  ?Intimate Partner Violence: Not on file  ? ?Review of Systems ?Appetite is fine ?Weight stable ?Sleeps well ?Wears seat belt ?Teeth good---keeps up with derm ?No suspicious skin lesions ?No heartburn or dysphagia ?Bowels move fine--no blood ?No dysuria or incontinence ?No chest pain or SOB ?No dizziness or syncope ?No edema ?   ?Objective:  ?  Physical Exam ?Constitutional:   ?   Appearance: Normal appearance.  ?HENT:  ?   Mouth/Throat:  ?   Comments: No lesions ?Eyes:  ?   Conjunctiva/sclera: Conjunctivae normal.  ?   Pupils: Pupils are equal, round, and reactive to light.  ?Cardiovascular:  ?   Rate and Rhythm: Normal rate and regular rhythm.  ?   Pulses: Normal pulses.  ?   Heart sounds: No murmur heard. ?  No gallop.  ?Pulmonary:  ?   Effort: Pulmonary effort is normal.  ?   Breath sounds: Normal breath sounds. No wheezing or rales.  ?Abdominal:  ?   Palpations: Abdomen is soft.  ?   Tenderness: There is no abdominal tenderness.  ?Musculoskeletal:  ?   Cervical back: Neck supple.  ?   Right lower leg: No  edema.  ?   Left lower leg: No edema.  ?Lymphadenopathy:  ?   Cervical: No cervical adenopathy.  ?Skin: ?   Findings: No lesion or rash.  ?Neurological:  ?   General: No focal deficit present.  ?   Mental Status: She is alert and oriented to person, place, and time.  ?   Comments: Mini-cog normal  ?Psychiatric:     ?   Mood and Affect: Mood normal.     ?   Behavior: Behavior normal.  ?  ? ? ? ? ?   ?Assessment & Plan:  ? ?

## 2021-08-08 NOTE — Assessment & Plan Note (Signed)
Better in the past year ?Hasn't needed the topical diclofenac ?

## 2021-08-08 NOTE — Progress Notes (Signed)
Hearing Screening - Comments:: Has a hearing aid. Not wearing it today. ?Vision Screening - Comments:: January 2023 ? ?

## 2021-08-08 NOTE — Assessment & Plan Note (Signed)
See social history 

## 2021-08-09 DIAGNOSIS — M25512 Pain in left shoulder: Secondary | ICD-10-CM | POA: Diagnosis not present

## 2021-08-16 DIAGNOSIS — M25512 Pain in left shoulder: Secondary | ICD-10-CM | POA: Diagnosis not present

## 2021-08-21 DIAGNOSIS — M25512 Pain in left shoulder: Secondary | ICD-10-CM | POA: Diagnosis not present

## 2021-08-30 DIAGNOSIS — M25512 Pain in left shoulder: Secondary | ICD-10-CM | POA: Diagnosis not present

## 2021-09-03 DIAGNOSIS — M25512 Pain in left shoulder: Secondary | ICD-10-CM | POA: Diagnosis not present

## 2021-12-21 DIAGNOSIS — M25512 Pain in left shoulder: Secondary | ICD-10-CM | POA: Diagnosis not present

## 2022-01-24 DIAGNOSIS — M1711 Unilateral primary osteoarthritis, right knee: Secondary | ICD-10-CM | POA: Diagnosis not present

## 2022-02-08 DIAGNOSIS — M25512 Pain in left shoulder: Secondary | ICD-10-CM | POA: Diagnosis not present

## 2022-02-16 DIAGNOSIS — M25512 Pain in left shoulder: Secondary | ICD-10-CM | POA: Diagnosis not present

## 2022-03-07 DIAGNOSIS — M25512 Pain in left shoulder: Secondary | ICD-10-CM | POA: Diagnosis not present

## 2022-03-08 DIAGNOSIS — M1711 Unilateral primary osteoarthritis, right knee: Secondary | ICD-10-CM | POA: Diagnosis not present

## 2022-03-08 DIAGNOSIS — E559 Vitamin D deficiency, unspecified: Secondary | ICD-10-CM | POA: Diagnosis not present

## 2022-03-08 DIAGNOSIS — Z139 Encounter for screening, unspecified: Secondary | ICD-10-CM | POA: Diagnosis not present

## 2022-03-08 DIAGNOSIS — Z9181 History of falling: Secondary | ICD-10-CM | POA: Diagnosis not present

## 2022-03-08 DIAGNOSIS — Z780 Asymptomatic menopausal state: Secondary | ICD-10-CM | POA: Diagnosis not present

## 2022-03-08 DIAGNOSIS — E538 Deficiency of other specified B group vitamins: Secondary | ICD-10-CM | POA: Diagnosis not present

## 2022-03-08 DIAGNOSIS — I10 Essential (primary) hypertension: Secondary | ICD-10-CM | POA: Diagnosis not present

## 2022-03-08 DIAGNOSIS — Z131 Encounter for screening for diabetes mellitus: Secondary | ICD-10-CM | POA: Diagnosis not present

## 2022-03-08 DIAGNOSIS — Z1331 Encounter for screening for depression: Secondary | ICD-10-CM | POA: Diagnosis not present

## 2022-08-15 ENCOUNTER — Encounter: Payer: Self-pay | Admitting: Internal Medicine

## 2022-08-15 ENCOUNTER — Ambulatory Visit (INDEPENDENT_AMBULATORY_CARE_PROVIDER_SITE_OTHER): Payer: PPO | Admitting: Internal Medicine

## 2022-08-15 VITALS — BP 138/88 | HR 80 | Temp 97.5°F | Ht 65.5 in | Wt 132.0 lb

## 2022-08-15 DIAGNOSIS — M159 Polyosteoarthritis, unspecified: Secondary | ICD-10-CM | POA: Insufficient documentation

## 2022-08-15 DIAGNOSIS — Z0000 Encounter for general adult medical examination without abnormal findings: Secondary | ICD-10-CM | POA: Diagnosis not present

## 2022-08-15 DIAGNOSIS — M1711 Unilateral primary osteoarthritis, right knee: Secondary | ICD-10-CM

## 2022-08-15 DIAGNOSIS — E538 Deficiency of other specified B group vitamins: Secondary | ICD-10-CM | POA: Diagnosis not present

## 2022-08-15 DIAGNOSIS — N1831 Chronic kidney disease, stage 3a: Secondary | ICD-10-CM | POA: Diagnosis not present

## 2022-08-15 DIAGNOSIS — M19041 Primary osteoarthritis, right hand: Secondary | ICD-10-CM

## 2022-08-15 LAB — CBC
HCT: 40.9 % (ref 36.0–46.0)
Hemoglobin: 13.8 g/dL (ref 12.0–15.0)
MCHC: 33.9 g/dL (ref 30.0–36.0)
MCV: 86.1 fl (ref 78.0–100.0)
Platelets: 213 10*3/uL (ref 150.0–400.0)
RBC: 4.75 Mil/uL (ref 3.87–5.11)
RDW: 13.6 % (ref 11.5–15.5)
WBC: 5 10*3/uL (ref 4.0–10.5)

## 2022-08-15 LAB — RENAL FUNCTION PANEL
Albumin: 4.8 g/dL (ref 3.5–5.2)
BUN: 17 mg/dL (ref 6–23)
CO2: 22 mEq/L (ref 19–32)
Calcium: 9.7 mg/dL (ref 8.4–10.5)
Chloride: 107 mEq/L (ref 96–112)
Creatinine, Ser: 0.86 mg/dL (ref 0.40–1.20)
GFR: 67.52 mL/min (ref >60.00)
Glucose, Bld: 86 mg/dL (ref 70–99)
Phosphorus: 4 mg/dL (ref 2.3–4.6)
Potassium: 4.2 mEq/L (ref 3.5–5.1)
Sodium: 143 mEq/L (ref 135–145)

## 2022-08-15 LAB — HEPATIC FUNCTION PANEL
ALT: 20 U/L (ref 0–35)
AST: 18 U/L (ref 0–37)
Albumin: 4.8 g/dL (ref 3.5–5.2)
Alkaline Phosphatase: 64 U/L (ref 39–117)
Bilirubin, Direct: 0.2 mg/dL (ref 0.0–0.3)
Total Bilirubin: 0.8 mg/dL (ref 0.2–1.2)
Total Protein: 6.9 g/dL (ref 6.0–8.3)

## 2022-08-15 LAB — TSH: TSH: 2.03 u[IU]/mL (ref 0.35–5.50)

## 2022-08-15 LAB — LIPID PANEL
Cholesterol: 163 mg/dL (ref 0–200)
HDL: 58.6 mg/dL (ref >39.00)
LDL Cholesterol: 93 mg/dL (ref 0–99)
NonHDL: 104.81
Total CHOL/HDL Ratio: 3
Triglycerides: 57 mg/dL (ref 0.0–149.0)
VLDL: 11.4 mg/dL (ref 0.0–40.0)

## 2022-08-15 LAB — VITAMIN D 25 HYDROXY (VIT D DEFICIENCY, FRACTURES): VITD: 43.4 ng/mL (ref 30.00–100.00)

## 2022-08-15 LAB — VITAMIN B12: Vitamin B-12: 894 pg/mL (ref 211–911)

## 2022-08-15 MED ORDER — MELOXICAM 7.5 MG PO TABS: 7.5000 mg | ORAL_TABLET | Freq: Every day | ORAL | 1 refills | Status: DC

## 2022-08-15 NOTE — Assessment & Plan Note (Signed)
Improved Can try topical diclofenac for prn use

## 2022-08-15 NOTE — Assessment & Plan Note (Signed)
I have personally reviewed the Medicare Annual Wellness questionnaire and have noted 1. The patient's medical and social history 2. Their use of alcohol, tobacco or illicit drugs 3. Their current medications and supplements 4. The patient's functional ability including ADL's, fall risks, home safety risks and hearing or visual             impairment. 5. Diet and physical activities 6. Evidence for depression or mood disorders  The patients weight, height, BMI and visual acuity have been recorded in the chart I have made referrals, counseling and provided education to the patient based review of the above and I have provided the pt with a written personalized care plan for preventive services.  I have provided you with a copy of your personalized plan for preventive services. Please take the time to review along with your updated medication list.  Healthy Discussed adding quad strengthening to her walking Updated COVID vaccine soon Thinks she had RSV--but will repeat the flu vaccine in the fall Colon due 2026--unless she has more blood Mammogram due now

## 2022-08-15 NOTE — Assessment & Plan Note (Signed)
Will check labs on supplements

## 2022-08-15 NOTE — Assessment & Plan Note (Signed)
Slightly low last year Will recheck If worse, will hold off on the oral NSAID

## 2022-08-15 NOTE — Progress Notes (Signed)
Subjective:    Patient ID: Catherine Strickland, female    DOB: 10-20-1950, 72 y.o.   MRN: [id number]  HPI Here for Medicare wellness visit and follow up of chronic health conditions Reviewed advanced directives Reviewed other doctors--Dr Bulakowski--opto, Dr Lissa Hoard, Dr Schuyler Amor, Dr Sylvie Farrier No hospitalizations or surgery in the past year Exercises regularly---extended walks Vision is okay Using headset for TV---has helped tremendously. Has hearing aide--but not using No alcohol or tobacco One fall while trimming hedges---no injury No depression or anhedonia Independent with instrumental ADLs No worrisome memory problems--just recall issues  Tripped in park 8 months ago--jerked her right knee Saw ortho and had cortisone injection Got meloxicam which she still uses--but only 1/2 (and not much lately) Did do PT for a while--but still not better Hasn't tried tylenol  Continues on B12 supplement  Has "icy" feeling substeranl Periodic burning sensation--?related to eating. Will use baking soda at times  No dysphagia  GFR last year was 56  Current Outpatient Medications on File Prior to Visit  Medication Sig Dispense Refill   cholecalciferol (VITAMIN D) 1000 UNITS tablet Take 1,000 Units by mouth daily.     Cyanocobalamin (VITAMIN B-12) 500 MCG SUBL Place 500 mcg under the tongue daily.     meloxicam (MOBIC) 15 MG tablet Take 15 mg by mouth daily as needed.     No current facility-administered medications on file prior to visit.    No Known Allergies  History reviewed. No pertinent past medical history.  Past Surgical History:  Procedure Laterality Date   ABDOMINAL HYSTERECTOMY     UMBILICAL HERNIA REPAIR N/A 10/17/2020   Procedure: HERNIA REPAIR UMBILICAL ADULT;  Surgeon: Earline Mayotte, MD;  Location: [facility] ORS;  Service: General;  Laterality: N/A;  open umbilical hernia repair   UTERINE FIBROID SURGERY     VAGINAL DELIVERY     x5    Family History   Problem Relation Age of Onset   COPD Father    Cancer Father        lung   Cancer Mother        lung cancer   Heart disease Neg Hx    Breast cancer Neg Hx     Social History   Socioeconomic History   Marital status: Married    Spouse name: Not on file   Number of children: 5   Years of education: Not on file   Highest education level: Not on file  Occupational History   Occupation: Catering manager    Comment: [employer]   Occupation: Musician    Comment: church  Tobacco Use   Smoking status: Never    Passive exposure: Past   Smokeless tobacco: Never  Vaping Use   Vaping Use: Never used  Substance and Sexual Activity   Alcohol use: No   Drug use: No   Sexual activity: Yes  Other Topics Concern   Not on file  Social History Narrative   Has living will   Husband is health care POA--alternate Armando Gang (daughter)   Would accept resuscitation   No tube feeds if cognitively unaware   Social Determinants of Health   Financial Resource Strain: Not on file  Food Insecurity: Not on file  Transportation Needs: Not on file  Physical Activity: Not on file  Stress: Not on file  Social Connections: Not on file  Intimate Partner Violence: Not on file   Review of Systems Appetite is good Weight is stable Sleeps well Wears seat belt  Gets some pain in big toes when walking--discussed foot wear Had crowns redone by new dentist--not a great experience No suspicious skin lesions No chest pain or SOB No dizziness or syncope No edema Chronic pain in [facility]--mostly just on right if she hits it. Never did the diclofenac Has skin outbreak periodically--derm told her it was stress related. Has cream from derm for prn Bowels move fine--but did note some blood yesterday (does have hemorrhoid) No urinary problems---does have some urgency    Objective:   Physical Exam Constitutional:      Appearance: Normal appearance.  HENT:     Mouth/Throat:     Pharynx: No  oropharyngeal exudate or posterior oropharyngeal erythema.  Eyes:     Conjunctiva/sclera: Conjunctivae normal.     Pupils: Pupils are equal, round, and reactive to light.  Cardiovascular:     Rate and Rhythm: Normal rate and regular rhythm.     Pulses: Normal pulses.     Heart sounds: No murmur heard.    No gallop.  Pulmonary:     Effort: Pulmonary effort is normal.     Breath sounds: Normal breath sounds. No wheezing or rales.  Abdominal:     Palpations: Abdomen is soft.     Tenderness: There is no abdominal tenderness.  Musculoskeletal:     Cervical back: Neck supple.     Right lower leg: No edema.     Left lower leg: No edema.  Lymphadenopathy:     Cervical: No cervical adenopathy.  Skin:    Findings: No rash.  Neurological:     General: No focal deficit present.     Mental Status: She is alert and oriented to person, place, and time.     Comments: Word naming 13/1 minute Recall 3/3  Psychiatric:        Mood and Affect: Mood normal.        Behavior: Behavior normal.            Assessment & Plan:

## 2022-08-15 NOTE — Patient Instructions (Signed)
Please try tylenol (acetaminophen) 650mg  up to three times a day for the knee You can also use over the counter diclofenac gel up to three times a day. Only use the meloxicam if it is really bad. Work on Interior and spatial designer as well

## 2022-08-15 NOTE — Progress Notes (Signed)
Hearing Screening - Comments:: Has a hearing aid. Is not wearing it today. Vision Screening - Comments:: May 2023

## 2022-08-15 NOTE — Assessment & Plan Note (Signed)
Some better now Discussed trying tylenol and topical diclofenac and saving meloxicam 7.5 daily for more severe pain

## 2022-11-22 ENCOUNTER — Other Ambulatory Visit: Payer: Self-pay | Admitting: Internal Medicine

## 2022-11-22 DIAGNOSIS — Z1231 Encounter for screening mammogram for malignant neoplasm of breast: Secondary | ICD-10-CM

## 2022-12-04 ENCOUNTER — Ambulatory Visit (INDEPENDENT_AMBULATORY_CARE_PROVIDER_SITE_OTHER): Payer: PPO | Admitting: Internal Medicine

## 2022-12-04 ENCOUNTER — Encounter: Payer: Self-pay | Admitting: Internal Medicine

## 2022-12-04 VITALS — BP 182/80 | HR 93 | Temp 98.1°F | Ht 65.5 in | Wt 131.0 lb

## 2022-12-04 DIAGNOSIS — R051 Acute cough: Secondary | ICD-10-CM | POA: Diagnosis not present

## 2022-12-04 DIAGNOSIS — R030 Elevated blood-pressure reading, without diagnosis of hypertension: Secondary | ICD-10-CM | POA: Insufficient documentation

## 2022-12-04 DIAGNOSIS — R059 Cough, unspecified: Secondary | ICD-10-CM | POA: Insufficient documentation

## 2022-12-04 DIAGNOSIS — R079 Chest pain, unspecified: Secondary | ICD-10-CM

## 2022-12-04 DIAGNOSIS — F39 Unspecified mood [affective] disorder: Secondary | ICD-10-CM

## 2022-12-04 LAB — COMPREHENSIVE METABOLIC PANEL
ALT: 26 U/L (ref 0–35)
AST: 19 U/L (ref 0–37)
Albumin: 4.7 g/dL (ref 3.5–5.2)
Alkaline Phosphatase: 69 U/L (ref 39–117)
BUN: 16 mg/dL (ref 6–23)
CO2: 25 mEq/L (ref 19–32)
Calcium: 9.5 mg/dL (ref 8.4–10.5)
Chloride: 105 mEq/L (ref 96–112)
Creatinine, Ser: 0.81 mg/dL (ref 0.40–1.20)
GFR: 72.4 mL/min (ref >60.00)
Glucose, Bld: 118 mg/dL — ABNORMAL HIGH (ref 70–99)
Potassium: 3.9 mEq/L (ref 3.5–5.1)
Sodium: 140 mEq/L (ref 135–145)
Total Bilirubin: 0.5 mg/dL (ref 0.2–1.2)
Total Protein: 7 g/dL (ref 6.0–8.3)

## 2022-12-04 LAB — TROPONIN I (HIGH SENSITIVITY): High Sens Troponin I: 3 ng/L (ref 2–17)

## 2022-12-04 LAB — CBC
HCT: 39.6 % (ref 36.0–46.0)
Hemoglobin: 13.4 g/dL (ref 12.0–15.0)
MCHC: 33.8 g/dL (ref 30.0–36.0)
MCV: 86 fl (ref 78.0–100.0)
Platelets: 241 10*3/uL (ref 150.0–400.0)
RBC: 4.61 Mil/uL (ref 3.87–5.11)
RDW: 13.3 % (ref 11.5–15.5)
WBC: 8.3 10*3/uL (ref 4.0–10.5)

## 2022-12-04 LAB — TSH: TSH: 1.3 u[IU]/mL (ref 0.35–5.50)

## 2022-12-04 MED ORDER — ALPRAZOLAM 0.25 MG PO TABS: 0.2500 mg | ORAL_TABLET | Freq: Two times a day (BID) | ORAL | 0 refills | Status: DC | PRN

## 2022-12-04 NOTE — Progress Notes (Signed)
Subjective:    Patient ID: Catherine Strickland, female    DOB: 1950-06-30, 72 y.o.   MRN: [id number]  HPI Here due to cough and chest congestion--and then chest pain With husband  Friend noted that she was shaking her foot--thinks it is nervous energy Having an issue with tension with a child--sent her an apology note but hasn't heard back from her  Started having some twinges in her chest last week Then congestion in throat and upper chest for 4-5 days Using mucinex for this Voice is off Was out working yesterday and did fine--but woke today ~5AM and felt heart (?racing) Wondered if she was not breathing right Got up and was gagging and had diarrhea and felt clammy. Close to passing out Better with cool washcloth from husband Stood and it recurred twice--so called rescue Got very anxious about whether she might be having a heart attack BP very high EKG done--was normal  No chest pain or twinges now Breathing is fine No fever No headache or sinus pressure Clear phlegm  Current Outpatient Medications on File Prior to Visit  Medication Sig Dispense Refill   cholecalciferol (VITAMIN D) 1000 UNITS tablet Take 1,000 Units by mouth daily.     Cyanocobalamin (VITAMIN B-12) 500 MCG SUBL Place 500 mcg under the tongue daily.     meloxicam (MOBIC) 7.5 MG tablet Take 1 tablet (7.5 mg total) by mouth daily. 30 tablet 1   No current facility-administered medications on file prior to visit.    No Known Allergies  History reviewed. No pertinent past medical history.  Past Surgical History:  Procedure Laterality Date   ABDOMINAL HYSTERECTOMY     UMBILICAL HERNIA REPAIR N/A 10/17/2020   Procedure: HERNIA REPAIR UMBILICAL ADULT;  Surgeon: Earline Mayotte, MD;  Location: [facility] ORS;  Service: General;  Laterality: N/A;  open umbilical hernia repair   UTERINE FIBROID SURGERY     VAGINAL DELIVERY     x5    Family History  Problem Relation Age of Onset   COPD Father    Cancer Father         lung   Cancer Mother        lung cancer   Heart disease Neg Hx    Breast cancer Neg Hx     Social History   Socioeconomic History   Marital status: Married    Spouse name: Not on file   Number of children: 5   Years of education: Not on file   Highest education level: Not on file  Occupational History   Occupation: Catering manager    Comment: [employer]   Occupation: Musician    Comment: church  Tobacco Use   Smoking status: Never    Passive exposure: Past   Smokeless tobacco: Never  Vaping Use   Vaping status: Never Used  Substance and Sexual Activity   Alcohol use: No   Drug use: No   Sexual activity: Yes  Other Topics Concern   Not on file  Social History Narrative   Has living will   Husband is health care POA--alternate Armando Gang (daughter)   Would accept resuscitation   No tube feeds if cognitively unaware   Social Determinants of Health   Financial Resource Strain: Not on file  Food Insecurity: Not on file  Transportation Needs: Not on file  Physical Activity: Not on file  Stress: Not on file  Social Connections: Not on file  Intimate Partner Violence: Not on file  Review of Systems Pulled back out--had to miss church this weekend. Did take one meloxicam a few days ago Did have some finger tingling with the presyncope    Objective:   Physical Exam Constitutional:      Appearance: Normal appearance.  HENT:     Mouth/Throat:     Pharynx: No oropharyngeal exudate or posterior oropharyngeal erythema.  Cardiovascular:     Rate and Rhythm: Normal rate and regular rhythm.     Pulses: Normal pulses.     Heart sounds: No murmur heard.    No gallop.  Pulmonary:     Effort: Pulmonary effort is normal.     Breath sounds: Normal breath sounds. No wheezing or rales.  Musculoskeletal:     Cervical back: Neck supple.     Right lower leg: No edema.     Left lower leg: No edema.  Lymphadenopathy:     Cervical: No cervical adenopathy.   Neurological:     Mental Status: She is alert.  Psychiatric:     Comments: Is anxious            Assessment & Plan:

## 2022-12-04 NOTE — Assessment & Plan Note (Addendum)
Symptoms not suggestive of coronary ischemia ?related to the cough Discussed ER evaluation if this recurs Will check troponin and other labs

## 2022-12-04 NOTE — Addendum Note (Signed)
Addended by: Tillman Abide I on: 12/04/2022 12:07 PM   Modules accepted: Orders

## 2022-12-04 NOTE — Assessment & Plan Note (Signed)
Quite high now Will have her monitor at home for now Hold on more meloxicam for now Recheck 1 week

## 2022-12-04 NOTE — Addendum Note (Signed)
Addended by: Alvina Chou on: 12/04/2022 12:17 PM   Modules accepted: Orders

## 2022-12-04 NOTE — Assessment & Plan Note (Signed)
4-5 days Most consistent with viral URI Discussed supportive Rx

## 2022-12-04 NOTE — Assessment & Plan Note (Signed)
Has had some anxiety Likely a cause for some of her symptoms and the elevated BP Will give xanax 0.25mg  for rare prn use

## 2022-12-11 ENCOUNTER — Ambulatory Visit
Admission: RE | Admit: 2022-12-11 | Discharge: 2022-12-11 | Disposition: A | Discharge status: Home / Self Care | Payer: PPO | Source: Ambulatory Visit | Attending: Internal Medicine | Admitting: Internal Medicine

## 2022-12-11 DIAGNOSIS — Z1231 Encounter for screening mammogram for malignant neoplasm of breast: Secondary | ICD-10-CM | POA: Insufficient documentation

## 2022-12-17 ENCOUNTER — Encounter: Payer: Self-pay | Admitting: Internal Medicine

## 2022-12-17 ENCOUNTER — Ambulatory Visit (INDEPENDENT_AMBULATORY_CARE_PROVIDER_SITE_OTHER): Payer: PPO | Admitting: Internal Medicine

## 2022-12-17 VITALS — BP 138/88 | HR 79 | Temp 98.0°F | Ht 65.5 in | Wt 132.0 lb

## 2022-12-17 DIAGNOSIS — F39 Unspecified mood [affective] disorder: Secondary | ICD-10-CM

## 2022-12-17 DIAGNOSIS — R030 Elevated blood-pressure reading, without diagnosis of hypertension: Secondary | ICD-10-CM

## 2022-12-17 NOTE — Progress Notes (Signed)
Subjective:    Patient ID: Catherine Strickland, female    DOB: January 11, 1951, 72 y.o.   MRN: [id number]  HPI Here for follow up of chest pain and anxiety  Feeling better Monitoring BP at home Here was 138/88 but her was 165/96 At home as low as 120/76 but up to 173/82  No chest pain No palpitations Did take 1/2 xanax twice--and a full one last night (and slept great) Still awakens at 3AM some days---with "adrenaline" type reaction (but no more diarrhea or gagging)  Still working through stress with daughter Uncertain now  Current Outpatient Medications on File Prior to Visit  Medication Sig Dispense Refill   ALPRAZolam (XANAX) 0.25 MG tablet Take 1 tablet (0.25 mg total) by mouth 2 (two) times daily as needed for anxiety. 20 tablet 0   cholecalciferol (VITAMIN D) 1000 UNITS tablet Take 1,000 Units by mouth daily.     Cyanocobalamin (VITAMIN B-12) 500 MCG SUBL Place 500 mcg under the tongue daily.     meloxicam (MOBIC) 7.5 MG tablet Take 1 tablet (7.5 mg total) by mouth daily. 30 tablet 1   No current facility-administered medications on file prior to visit.    No Known Allergies  History reviewed. No pertinent past medical history.  Past Surgical History:  Procedure Laterality Date   ABDOMINAL HYSTERECTOMY     UMBILICAL HERNIA REPAIR N/A 10/17/2020   Procedure: HERNIA REPAIR UMBILICAL ADULT;  Surgeon: Earline Mayotte, MD;  Location: [facility] ORS;  Service: General;  Laterality: N/A;  open umbilical hernia repair   UTERINE FIBROID SURGERY     VAGINAL DELIVERY     x5    Family History  Problem Relation Age of Onset   COPD Father    Cancer Father        lung   Cancer Mother        lung cancer   Heart disease Neg Hx    Breast cancer Neg Hx     Social History   Socioeconomic History   Marital status: Married    Spouse name: Not on file   Number of children: 5   Years of education: Not on file   Highest education level: Not on file  Occupational History    Occupation: Catering manager    Comment: [employer]   Occupation: Musician    Comment: church  Tobacco Use   Smoking status: Never    Passive exposure: Past   Smokeless tobacco: Never  Vaping Use   Vaping status: Never Used  Substance and Sexual Activity   Alcohol use: No   Drug use: No   Sexual activity: Yes  Other Topics Concern   Not on file  Social History Narrative   Has living will   Husband is health care POA--alternate Armando Gang (daughter)   Would accept resuscitation   No tube feeds if cognitively unaware   Social Determinants of Health   Financial Resource Strain: Not on file  Food Insecurity: Not on file  Transportation Needs: Not on file  Physical Activity: Not on file  Stress: Not on file  Social Connections: Not on file  Intimate Partner Violence: Not on file   Review of Systems Went to derm--told rash was from nerves (and got TAC to use)     Objective:   Physical Exam Constitutional:      Appearance: Normal appearance.  Cardiovascular:     Rate and Rhythm: Normal rate and regular rhythm.     Heart sounds: No  murmur heard.    No gallop.  Pulmonary:     Effort: Pulmonary effort is normal.     Breath sounds: Normal breath sounds. No wheezing or rales.  Neurological:     Mental Status: She is alert.  Psychiatric:     Comments: Still upset about things            Assessment & Plan:

## 2022-12-17 NOTE — Assessment & Plan Note (Signed)
Still having stress Discussed that her spells awakening ---sound like awakening from bad dreams Discussed clearing her mind before sleep Can use the alprazolam prn

## 2022-12-17 NOTE — Assessment & Plan Note (Signed)
BP Readings from Last 3 Encounters:  12/17/22 138/88  12/04/22 (!) 182/80  08/15/22 138/88   Was elevated when I did it--but her cuff was 30 mm over both our readings She will get another monitor Avoiding the meloxicam

## 2022-12-26 DIAGNOSIS — M25559 Pain in unspecified hip: Secondary | ICD-10-CM | POA: Diagnosis not present

## 2023-01-27 DIAGNOSIS — M25559 Pain in unspecified hip: Secondary | ICD-10-CM | POA: Diagnosis not present

## 2023-03-22 ENCOUNTER — Ambulatory Visit (INDEPENDENT_AMBULATORY_CARE_PROVIDER_SITE_OTHER): Payer: PPO | Admitting: Family

## 2023-03-22 ENCOUNTER — Other Ambulatory Visit: Payer: Self-pay | Admitting: *Deleted

## 2023-03-22 VITALS — BP 120/86 | HR 107 | Temp 97.8°F | Wt 133.8 lb

## 2023-03-22 DIAGNOSIS — R059 Cough, unspecified: Secondary | ICD-10-CM | POA: Diagnosis not present

## 2023-03-22 DIAGNOSIS — J069 Acute upper respiratory infection, unspecified: Secondary | ICD-10-CM

## 2023-03-22 DIAGNOSIS — J029 Acute pharyngitis, unspecified: Secondary | ICD-10-CM

## 2023-03-22 LAB — POCT RAPID STREP A (OFFICE): Rapid Strep A Screen: NEGATIVE

## 2023-03-22 LAB — POC COVID19 BINAXNOW: SARS Coronavirus 2 Ag: NEGATIVE

## 2023-03-22 MED ORDER — AMOXICILLIN-POT CLAVULANATE 875-125 MG PO TABS: 1.0000 | ORAL_TABLET | Freq: Two times a day (BID) | ORAL | 0 refills | Status: DC

## 2023-03-22 NOTE — Progress Notes (Unsigned)
   Established Patient Office Visit  Subjective:   Patient ID: Catherine Strickland, female    DOB: Apr 15, 1951  Age: 72 y.o. MRN: [id number]  CC:  Chief Complaint  Patient presents with   Acute Visit    Reports sinus congestion, cough and PND. Has been taking Mucinex and Sudafed with minimal relief.    HPI: Catherine Strickland is a 72 y.o. female presenting on 03/22/2023 for Acute Visit (Reports sinus congestion, cough and PND. Has been taking Mucinex and Sudafed with minimal relief.)  Started with sore throat, sinus pressure, pnd and productive cough. Throat improving some but still with some ongoing soreness, with hoarseness, this am when coughing she had productive green sputum. No fever.   Otc sudafed and mucinex without much improvement.        ROS: Negative unless specifically indicated above in HPI.   Relevant past medical history reviewed and updated as indicated.   Allergies and medications reviewed and updated.   Current Outpatient Medications:    ALPRAZolam (XANAX) 0.25 MG tablet, Take 1 tablet (0.25 mg total) by mouth 2 (two) times daily as needed for anxiety., Disp: 20 tablet, Rfl: 0   cholecalciferol (VITAMIN D) 1000 UNITS tablet, Take 1,000 Units by mouth daily., Disp: , Rfl:    Cyanocobalamin (VITAMIN B-12) 500 MCG SUBL, Place 500 mcg under the tongue daily., Disp: , Rfl:    meloxicam (MOBIC) 7.5 MG tablet, Take 1 tablet (7.5 mg total) by mouth daily., Disp: 30 tablet, Rfl: 1  No Known Allergies  Objective:   BP 120/86 (BP Location: Left Arm, Patient Position: Sitting, Cuff Size: Normal)   Pulse (!) 107   Temp 97.8 F (36.6 C) (Temporal)   Wt 133 lb 12.8 oz (60.7 kg)   SpO2 98%   BMI 21.93 kg/m    Physical Exam  Assessment & Plan:  There are no diagnoses linked to this encounter.   Follow up plan: No follow-ups on file.  Mort Sawyers, FNP

## 2023-03-25 DIAGNOSIS — J069 Acute upper respiratory infection, unspecified: Secondary | ICD-10-CM | POA: Insufficient documentation

## 2023-03-25 NOTE — Assessment & Plan Note (Signed)
Strep and covid neg in office. Advised patient on supportive measures:  Be sure to rest, drink plenty of fluids, and use tylenol or ibuprofen as needed for pain. Follow up if fever >101, if symptoms worsen or if symptoms are not improved in 3 days. Patient verbalizes understanding.

## 2023-04-02 ENCOUNTER — Telehealth: Payer: Self-pay | Admitting: Internal Medicine

## 2023-04-02 ENCOUNTER — Other Ambulatory Visit: Payer: Self-pay | Admitting: Internal Medicine

## 2023-04-02 MED ORDER — ALPRAZOLAM 0.25 MG PO TABS: 0.2500 mg | ORAL_TABLET | Freq: Two times a day (BID) | ORAL | 0 refills | Status: DC | PRN

## 2023-04-02 NOTE — Telephone Encounter (Signed)
Called patient states that after a few days the sore throat and cough had gotten better. She is still having the original symptoms of  sinus congestion and drip. She has started back on the over the counter medications.  Patient has not been taking any nose spray or allergy medications.

## 2023-04-02 NOTE — Telephone Encounter (Signed)
Prescription Request  04/02/2023  LOV: 12/17/2022  What is the name of the medication or equipment? ALPRAZolam (XANAX) 0.25 MG tablet   Have you contacted your pharmacy to request a refill? Yes   Which pharmacy would you like this sent to?  [facility] - [place], [place] - [address] [place] [address] Phone: [phone] Fax: [phone]    Patient notified that their request is being sent to the clinical staff for review and that they should receive a response within 2 business days.   Please advise at Mobile [phone] (mobile)

## 2023-04-02 NOTE — Telephone Encounter (Signed)
Last filled 12-04-22 #20 Last OV Acute 03-22-23 No Future OV [facility]

## 2023-04-02 NOTE — Telephone Encounter (Signed)
Pt called in and would like to talk to Tabitha dugal CMA pt stated that she finished the antibiotic but is still having the sinus stuffy noise sinus noise dripping do she keep taking the over counter medication

## 2023-04-19 DIAGNOSIS — H5213 Myopia, bilateral: Secondary | ICD-10-CM | POA: Diagnosis not present

## 2023-04-19 DIAGNOSIS — H524 Presbyopia: Secondary | ICD-10-CM | POA: Diagnosis not present

## 2023-04-19 DIAGNOSIS — H52223 Regular astigmatism, bilateral: Secondary | ICD-10-CM | POA: Diagnosis not present

## 2023-04-19 DIAGNOSIS — H35373 Puckering of macula, bilateral: Secondary | ICD-10-CM | POA: Diagnosis not present

## 2023-04-29 ENCOUNTER — Telehealth: Payer: Self-pay | Admitting: *Deleted

## 2023-04-29 NOTE — Telephone Encounter (Signed)
Spoke to pt. She bought a new BP cuff last week. Does not take meloxicam daily. Was also on Sudafed and mucinex. She is going to come on in tomorrow. She will bring her new cuff, also.

## 2023-04-29 NOTE — Telephone Encounter (Signed)
Copied from CRM [id number]. Topic: Clinical - Medical Advice >> Apr 29, 2023  9:18 AM Orinda Kenner C wrote: Reason for CRM: When pt first toke Meloxicam the b/p was over 200, pt is unsure. BP readings:  163/96 Friday and Sat morning 170/95 Friday and Sat night  187/100 Sunday morning   197/103 Sunday afternoon 187/94 Monday morning, ring finger swelling this morning Pt denies, headaches, dizzines or blurry vision at this moment. Pt started taking her husband med Lodipine-Besylate 5 mg yesterday and today. Pt is dealing with her anxiety so, unsure if that's the cause of her b/p being high. Pls advise and c/b [phone]

## 2023-04-30 ENCOUNTER — Ambulatory Visit (INDEPENDENT_AMBULATORY_CARE_PROVIDER_SITE_OTHER): Payer: PPO | Admitting: Internal Medicine

## 2023-04-30 ENCOUNTER — Encounter: Payer: Self-pay | Admitting: Internal Medicine

## 2023-04-30 VITALS — BP 190/103 | HR 73 | Temp 98.0°F | Ht 65.5 in | Wt 136.0 lb

## 2023-04-30 DIAGNOSIS — I10 Essential (primary) hypertension: Secondary | ICD-10-CM

## 2023-04-30 DIAGNOSIS — J988 Other specified respiratory disorders: Secondary | ICD-10-CM | POA: Insufficient documentation

## 2023-04-30 MED ORDER — VALSARTAN-HYDROCHLOROTHIAZIDE 80-12.5 MG PO TABS: 1.0000 | ORAL_TABLET | Freq: Every day | ORAL | 3 refills | Status: DC

## 2023-04-30 NOTE — Assessment & Plan Note (Signed)
Improved some but goes back 6 weeks Could by atypical infection If persists or worsens, would try z-pak

## 2023-04-30 NOTE — Progress Notes (Signed)
Subjective:    Patient ID: Catherine Strickland, female    DOB: 1950-11-20, 72 y.o.   MRN: [id number]  HPI Here due to persistent elevated BP Also still with respiratory symptoms  BP 158/90 this morning Home machine did register higher than ours  Took meloxicam once BP 145/89--197/103 Most systolic over 160 Has had some stress lately---and was lower in past 2 days No headache No SOB---will occasionally have "twinge" in chest Still not right since last month--despite the antibiotic, sudafed, etc  Green mucus is gone Stuffy sinuses still  Ear twinge--gone now  Did take amlodipine 5mg  last 2 days  Current Outpatient Medications on File Prior to Visit  Medication Sig Dispense Refill   ALPRAZolam (XANAX) 0.25 MG tablet Take 1 tablet (0.25 mg total) by mouth 2 (two) times daily as needed for anxiety. 20 tablet 0   cholecalciferol (VITAMIN D) 1000 UNITS tablet Take 1,000 Units by mouth daily.     Cyanocobalamin (VITAMIN B-12) 500 MCG SUBL Place 500 mcg under the tongue daily.     meloxicam (MOBIC) 7.5 MG tablet Take 1 tablet (7.5 mg total) by mouth daily. 30 tablet 1   No current facility-administered medications on file prior to visit.    No Known Allergies  History reviewed. No pertinent past medical history.  Past Surgical History:  Procedure Laterality Date   ABDOMINAL HYSTERECTOMY     UMBILICAL HERNIA REPAIR N/A 10/17/2020   Procedure: HERNIA REPAIR UMBILICAL ADULT;  Surgeon: Earline Mayotte, MD;  Location: [facility] ORS;  Service: General;  Laterality: N/A;  open umbilical hernia repair   UTERINE FIBROID SURGERY     VAGINAL DELIVERY     x5    Family History  Problem Relation Age of Onset   COPD Father    Cancer Father        lung   Cancer Mother        lung cancer   Heart disease Neg Hx    Breast cancer Neg Hx     Social History   Socioeconomic History   Marital status: Married    Spouse name: Not on file   Number of children: 5   Years of education: Not on  file   Highest education level: Not on file  Occupational History   Occupation: Catering manager    Comment: [employer]   Occupation: Musician    Comment: church  Tobacco Use   Smoking status: Never    Passive exposure: Past   Smokeless tobacco: Never  Vaping Use   Vaping status: Never Used  Substance and Sexual Activity   Alcohol use: No   Drug use: No   Sexual activity: Yes  Other Topics Concern   Not on file  Social History Narrative   Has living will   Husband is health care POA--alternate Armando Gang (daughter)   Would accept resuscitation   No tube feeds if cognitively unaware   Social Drivers of Health   Financial Resource Strain: Not on file  Food Insecurity: Not on file  Transportation Needs: Not on file  Physical Activity: Not on file  Stress: Not on file  Social Connections: Not on file  Intimate Partner Violence: Not on file   Review of Systems No N/V Eating okay    Objective:   Physical Exam Constitutional:      Appearance: Normal appearance.  HENT:     Right Ear: Tympanic membrane and ear canal normal.     Left Ear: Tympanic membrane and  ear canal normal.     Mouth/Throat:     Pharynx: No oropharyngeal exudate or posterior oropharyngeal erythema.  Cardiovascular:     Rate and Rhythm: Normal rate and regular rhythm.     Heart sounds: No murmur heard.    No gallop.  Pulmonary:     Effort: Pulmonary effort is normal.     Breath sounds: Normal breath sounds. No wheezing or rales.  Musculoskeletal:     Cervical back: Neck supple.  Lymphadenopathy:     Cervical: No cervical adenopathy.  Neurological:     Mental Status: She is alert.            Assessment & Plan:

## 2023-04-30 NOTE — Assessment & Plan Note (Signed)
BP Readings from Last 3 Encounters:  04/30/23 (!) 190/103  03/22/23 120/86  12/17/22 138/88   Repeat by me 196/100 (and her cuff 192/100) Discussed issues---I feel it is time to start treatment Will start valsartan/hydrochlorothiazide 80/12.5 Recheck 1 month

## 2023-05-06 ENCOUNTER — Telehealth: Payer: Self-pay

## 2023-05-06 ENCOUNTER — Encounter: Payer: Self-pay | Admitting: Internal Medicine

## 2023-05-06 NOTE — Telephone Encounter (Signed)
Copied from CRM [id number]. Topic: General - Call Back - No Documentation >> May 06, 2023  9:53 AM Catherine Strickland wrote: Reason for CRM: Patient is requesting to speak with Carollee Herter in regard to her MyChart message.

## 2023-05-06 NOTE — Telephone Encounter (Signed)
Spoke to pt. This am BP was 143/89 96 HR. Has not taken her medication this morning, but will go ahead and take it. I advised her that the HR may be up for her, but a normal HR is from 60-100. That made her feel a little bit better. She will wait to hear back from Dr Alphonsus Sias. She knows it may not be today.   She is asking if a change in her medication is needed to help lower her BP and HR.

## 2023-05-06 NOTE — Telephone Encounter (Signed)
I spoke to her. I advised her that 60-100 is a normal HR level so she is basically in the normal range.

## 2023-05-15 ENCOUNTER — Ambulatory Visit: Payer: Self-pay | Admitting: Internal Medicine

## 2023-05-15 NOTE — Telephone Encounter (Signed)
 Copied from CRM [id number]. Topic: Clinical - Red Word Triage >> May 15, 2023 10:02 AM Catherine Strickland wrote: [facility] that prompted transfer to Nurse Triage: HEART RATE.   Patient called to advise that she woke up this morning to an elevated HR. She expressed anxiety about this and wanted to know if she needed to go to the Urgent Care.  108 9AM 101/77  120 9:945AM 133/84  palpitations during the evening  heart rate continued to escalate  Patient has been doing water fast since 10PM Saturday night. Took blood pressure medicine on "Sunday and Monday. Patient doesn't want to break water fast.   Chief Complaint: Blood Pressure Symptoms: Swooshing sensation in chest, low blood pressures Frequency: Since Saturday. Pertinent Negatives: Patient denies shortness of breath Disposition: []ED /[]Urgent Care (no appt availability in office) / [x]Appointment(In office/virtual)/ [] [place]/ []Home Care/ []Refused Recommended Disposition /[] Mobile Bus/ [] Follow-up with PCP Additional Notes: Catherine Strickland is a 72 year old female being triaged for  fluctuating blood pressure readings and increased heart rate. 119/76, 92 BPM average on NYE. Recently 80s systolic and 60s diastolic the day before yesterday. Patient has reported fluctuating blood pressure readings since undergoing a water fast for the past three and half days. The patient reports drinking an average of 24-32 oz of water daily, and had a swooshing sensation in her chest when bending over on one of the days during the fast. The patient is additionally concerned about her heart rate being faster than normal. Provided education to the patient regarding the body's compensation and coping mechanisms and the mechanisms of action of the new medication prescribed. Appointment made in office for evaluation and lab work and to effectively address the patient's concerns.     Answer Assessment - Initial Assessment Questions 1. BLOOD PRESSURE: What is  the blood pressure? Did you take at least two measurements 5 minutes apart?     119/72 (Monday averages), 111/73, 101/77, 108.   2. ONSET: When did you take your blood pressure?     Mornings and Evenings, This morning was most recent.   3. HOW: How did you obtain the blood pressure? (e.g., visiting nurse, automatic home BP monitor)     Automatic  4. HISTORY: Do you have a history of low blood pressure? What is your blood pressure normally?     Hypertension patient,  5. MEDICINES: Are you taking any medications for blood pressure? If Yes, ask: Have they been changed recently?     Yes, on new antihypertensive -diuretic  6. PULSE RATE: Do you know what your pulse rate is?      12" 2  7. OTHER SYMPTOMS: Have you been sick recently? Have you had a recent injury?     No  8. PREGNANCY: Is there any chance you are pregnant? When was your last menstrual period?     No  Protocols used: Blood Pressure - Low-A-AH

## 2023-05-15 NOTE — Telephone Encounter (Signed)
 Okay---I will check her at the appt tomorrow

## 2023-05-16 ENCOUNTER — Ambulatory Visit (INDEPENDENT_AMBULATORY_CARE_PROVIDER_SITE_OTHER): Payer: PPO | Admitting: Internal Medicine

## 2023-05-16 VITALS — BP 126/74 | Ht 66.0 in | Wt 128.4 lb

## 2023-05-16 DIAGNOSIS — I10 Essential (primary) hypertension: Secondary | ICD-10-CM | POA: Diagnosis not present

## 2023-05-16 DIAGNOSIS — R002 Palpitations: Secondary | ICD-10-CM

## 2023-05-16 NOTE — Assessment & Plan Note (Signed)
 BP Readings from Last 3 Encounters:  05/16/23 126/74  04/30/23 (!) 190/103  03/22/23 120/86   BP much better even without the BP med in the past few days Her stress is less She is determined to eat healthier  Will stop the BP med She will monitor and let me know if BP persistently over 150/90 (would restart medicine med at 1/2 daily)

## 2023-05-16 NOTE — Progress Notes (Signed)
 Subjective:    Patient ID: Catherine Strickland, female    DOB: 08/25/1950, 73 y.o.   MRN: [id number]  HPI Here due to palpitations and low blood pressure With husband Also with feeling in her chest--like swooshing  Has been doing water fast---only water Has done it before---up to 10 days Started it again for spiritual reasons mostly (and hoping it would help her BP also)--10PM 05/11/23 Did take the BP pill ---1/5 (but held it 1/4 because BP low--then took 1/2) 3 days ago-- BP average 119/76 and HR average 92 That night---felt swoosh in chest leaning over in the shower 2 days ago--BP low in AM--so held BP med. Average BP 111/73 and HR average 97 Didn't take the med yesterday either---was able to do slow walk, but had some of that swooshing. Average 115/84 and average pulse 102 Did get heart rate up to 120--first thing yest AM Today BP 108/75 and HR 107  Planned to finish the 7 day water fast Energy is okay--but feels her heart even just walking  Current Outpatient Medications on File Prior to Visit  Medication Sig Dispense Refill   ALPRAZolam  (XANAX ) 0.25 MG tablet Take 1 tablet (0.25 mg total) by mouth 2 (two) times daily as needed for anxiety. 20 tablet 0   cholecalciferol (VITAMIN D ) 1000 UNITS tablet Take 1,000 Units by mouth daily.     Cyanocobalamin  (VITAMIN B-12) 500 MCG SUBL Place 500 mcg under the tongue daily.     meloxicam  (MOBIC ) 7.5 MG tablet Take 1 tablet (7.5 mg total) by mouth daily. (Patient not taking: Reported on 05/16/2023) 30 tablet 1   valsartan -hydrochlorothiazide  (DIOVAN -HCT) 80-12.5 MG tablet Take 1 tablet by mouth daily. (Patient not taking: Reported on 05/16/2023) 90 tablet 3   No current facility-administered medications on file prior to visit.    No Known Allergies  History reviewed. No pertinent past medical history.  Past Surgical History:  Procedure Laterality Date   ABDOMINAL HYSTERECTOMY     UMBILICAL HERNIA REPAIR N/A 10/17/2020   Procedure: HERNIA  REPAIR UMBILICAL ADULT;  Surgeon: Dessa Reyes ORN, MD;  Location: [facility] ORS;  Service: General;  Laterality: N/A;  open umbilical hernia repair   UTERINE FIBROID SURGERY     VAGINAL DELIVERY     x5    Family History  Problem Relation Age of Onset   COPD Father    Cancer Father        lung   Cancer Mother        lung cancer   Heart disease Neg Hx    Breast cancer Neg Hx     Social History   Socioeconomic History   Marital status: Married    Spouse name: Not on file   Number of children: 5   Years of education: Not on file   Highest education level: Associate degree: occupational, scientist, product/process development, or vocational program  Occupational History   Occupation: Catering Manager    Comment: [employer]   Occupation: Musician    Comment: church  Tobacco Use   Smoking status: Never    Passive exposure: Past   Smokeless tobacco: Never  Vaping Use   Vaping status: Never Used  Substance and Sexual Activity   Alcohol use: No   Drug use: No   Sexual activity: Yes  Other Topics Concern   Not on file  Social History Narrative   Has living will   Husband is health care POA--alternate Jon Groom (daughter)   Would accept resuscitation   No  tube feeds if cognitively unaware   Social Drivers of Health   Financial Resource Strain: Low Risk  (05/16/2023)   Overall Financial Resource Strain (CARDIA)    Difficulty of Paying Living Expenses: Not hard at all  Food Insecurity: No Food Insecurity (05/16/2023)   Hunger Vital Sign    Worried About Running Out of Food in the Last Year: Never true    Ran Out of Food in the Last Year: Never true  Transportation Needs: No Transportation Needs (05/16/2023)   PRAPARE - Administrator, Civil Service (Medical): No    Lack of Transportation (Non-Medical): No  Physical Activity: Sufficiently Active (05/16/2023)   Exercise Vital Sign    Days of Exercise per Week: 5 days    Minutes of Exercise per Session: 60 min  Stress: No Stress Concern  Present (05/16/2023)   [employer] of Occupational Health - Occupational Stress Questionnaire    Feeling of Stress : Only a little  Social Connections: Socially Integrated (05/16/2023)   Social Connection and Isolation Panel [NHANES]    Frequency of Communication with Friends and Family: More than three times a week    Frequency of Social Gatherings with Friends and Family: Three times a week    Attends Religious Services: More than 4 times per year    Active Member of Clubs or Organizations: Yes    Attends Engineer, Structural: More than 4 times per year    Marital Status: Married  Catering Manager Violence: Not on file   Review of Systems     Objective:   Physical Exam Constitutional:      Appearance: Normal appearance.  Cardiovascular:     Rate and Rhythm: Normal rate and regular rhythm.     Heart sounds: No murmur heard.    No gallop.     Comments: Rate in 90's Pulmonary:     Effort: Pulmonary effort is normal.     Breath sounds: Normal breath sounds. No wheezing or rales.  Abdominal:     Palpations: Abdomen is soft.     Tenderness: There is no abdominal tenderness.  Musculoskeletal:     Cervical back: Neck supple.     Right lower leg: No edema.     Left lower leg: No edema.  Lymphadenopathy:     Cervical: No cervical adenopathy.  Skin:    Comments: Small spot in at lower sternum ---not worrisome  Neurological:     Mental Status: She is alert.  Psychiatric:        Mood and Affect: Mood normal.        Behavior: Behavior normal.            Assessment & Plan:

## 2023-05-16 NOTE — Assessment & Plan Note (Signed)
 Has had tachycardia--that sounds appropriate to the water diet she is on Discussed the metabolic implications and recommended she start eating again (though more healthy)  EKG shows sinus at 91. Low voltage in limb leads (new since 6/22) no ischemic changes or hypertrophy

## 2023-07-12 ENCOUNTER — Other Ambulatory Visit: Payer: Self-pay

## 2023-07-12 ENCOUNTER — Telehealth: Payer: Self-pay

## 2023-07-12 ENCOUNTER — Ambulatory Visit

## 2023-07-12 VITALS — Ht 66.0 in | Wt 128.0 lb

## 2023-07-12 DIAGNOSIS — Z0000 Encounter for general adult medical examination without abnormal findings: Secondary | ICD-10-CM

## 2023-07-12 NOTE — Patient Instructions (Signed)
 Ms. Catherine Strickland , Thank you for taking time to come for your Medicare Wellness Visit. I appreciate your ongoing commitment to your health goals. Please review the following plan we discussed and let me know if I can assist you in the future.   Referrals/Orders/Follow-Ups/Clinician Recommendations: none  This is a list of the screening recommended for you and due dates:  Health Maintenance  Topic Date Due   Hepatitis C Screening  Never done   Colon Cancer Screening  01/30/2023   Medicare Annual Wellness Visit  07/11/2024   Mammogram  12/10/2024   DTaP/Tdap/Td vaccine (3 - Td or Tdap) 10/25/2029   Pneumonia Vaccine  Completed   Flu Shot  Completed   DEXA scan (bone density measurement)  Completed   COVID-19 Vaccine  Completed   Zoster (Shingles) Vaccine  Completed   HPV Vaccine  Aged Out    Advanced directives: (Copy Requested) Please bring a copy of your health care power of attorney and living will to the office to be added to your chart at your convenience. You can mail to [facility] [address] [place], [place] [address] or email to [email or web address]  Next Medicare Annual Wellness Visit scheduled for next year: Yes 07/15/24 @ 10:50am televisit

## 2023-07-12 NOTE — Telephone Encounter (Signed)
 Received a message from [facility], LPN that pt needed "all" of her medications refilled.  Left message for pt to call back and verify exactly what all she needed. The valsartan has refills at the pharmacy. Is she needing alprazolam and meloxicam?

## 2023-07-12 NOTE — Telephone Encounter (Signed)
 Copied from CRM [id number]. Topic: Clinical - Medication Question >> Jul 12, 2023  4:37 PM Denese Killings wrote: Reason for CRM: Patient wants to let Carollee Herter know that she is needing Alprazolam and Meloxicam.  *did advise patient that Valsartan has refills.

## 2023-07-12 NOTE — Progress Notes (Signed)
 Subjective:   Catherine Strickland is a 73 y.o. who presents for a Medicare Wellness preventive visit.  Visit Complete: Virtual I connected with  Ermalene Searing on 07/12/23 by a video and audio enabled telemedicine application and verified that I am speaking with the correct person using two identifiers.  Patient Location: Home  Provider Location: Home Office  I discussed the limitations of evaluation and management by telemedicine. The patient expressed understanding and agreed to proceed.  Vital Signs: Because this visit was a virtual/telehealth visit, some criteria may be missing or patient reported. Any vitals not documented were not able to be obtained and vitals that have been documented are patient reported.  AWV Questionnaire: No: Patient Medicare AWV questionnaire was not completed prior to this visit.  Cardiac Risk Factors include: advanced age (>22men, >99 women);hypertension     Objective:    Today's Vitals   07/12/23 1044  Weight: 128 lb (58.1 kg)  Height: 5\' 6"  (1.676 m)   Body mass index is 20.66 kg/m.     07/12/2023   11:25 AM 10/17/2020    8:30 AM 10/12/2020    9:30 AM  Advanced Directives  Does Patient Have a Medical Advance Directive? Yes No Yes  Type of Estate agent of [place];Living will  Living will  Copy of Healthcare Power of Attorney in Chart? No - copy requested    Would patient like information on creating a medical advance directive?  No - Patient declined     Current Medications (verified) Outpatient Encounter Medications as of 07/12/2023  Medication Sig   cholecalciferol (VITAMIN D) 1000 UNITS tablet Take 1,000 Units by mouth daily.   Cyanocobalamin (VITAMIN B-12) 500 MCG SUBL Place 500 mcg under the tongue daily.   meloxicam (MOBIC) 7.5 MG tablet Take 1 tablet (7.5 mg total) by mouth daily.   valsartan-hydrochlorothiazide (DIOVAN-HCT) 80-12.5 MG tablet Take 1 tablet by mouth daily.   ALPRAZolam (XANAX) 0.25 MG tablet Take 1  tablet (0.25 mg total) by mouth 2 (two) times daily as needed for anxiety.   No facility-administered encounter medications on file as of 07/12/2023.    Allergies (verified) Patient has no known allergies.   History: Past Medical History:  Diagnosis Date   Allergy    Anxiety    2024 occassionally   Arthritis    Knee and hand   GERD (gastroesophageal reflux disease) 2019   Past Surgical History:  Procedure Laterality Date   ABDOMINAL HYSTERECTOMY     HERNIA REPAIR  2022   UMBILICAL HERNIA REPAIR N/A 10/17/2020   Procedure: HERNIA REPAIR UMBILICAL ADULT;  Surgeon: Earline Mayotte, MD;  Location: [facility] ORS;  Service: General;  Laterality: N/A;  open umbilical hernia repair   UTERINE FIBROID SURGERY     VAGINAL DELIVERY     x5   Family History  Problem Relation Age of Onset   COPD Father    Cancer Father        lung   Cancer Mother        lung cancer   Diabetes Maternal Grandfather    Heart disease Maternal Grandmother    COPD Paternal Grandmother    Alcohol abuse Brother    Early death Brother    Alcohol abuse Daughter    Anxiety disorder Son    Cancer Sister    Early death Sister    Miscarriages / [place] Daughter    Breast cancer Neg Hx    Social History   Socioeconomic History  Marital status: Married    Spouse name: Not on file   Number of children: 5   Years of education: Not on file   Highest education level: Associate degree: occupational, Scientist, product/process development, or vocational program  Occupational History   Occupation: Catering manager    Comment: [employer]   Occupation: Musician    Comment: church  Tobacco Use   Smoking status: Never    Passive exposure: Past   Smokeless tobacco: Never  Vaping Use   Vaping status: Never Used  Substance and Sexual Activity   Alcohol use: Never   Drug use: Never   Sexual activity: Not Currently  Other Topics Concern   Not on file  Social History Narrative   Has living will   Husband is health care  POA--alternate Armando Gang (daughter)   Would accept resuscitation   No tube feeds if cognitively unaware   Social Drivers of Health   Financial Resource Strain: Low Risk  (07/12/2023)   Overall Financial Resource Strain (CARDIA)    Difficulty of Paying Living Expenses: Not hard at all  Food Insecurity: No Food Insecurity (07/12/2023)   Hunger Vital Sign    Worried About Running Out of Food in the Last Year: Never true    Ran Out of Food in the Last Year: Never true  Transportation Needs: No Transportation Needs (07/12/2023)   PRAPARE - Administrator, Civil Service (Medical): No    Lack of Transportation (Non-Medical): No  Physical Activity: Sufficiently Active (07/12/2023)   Exercise Vital Sign    Days of Exercise per Week: 5 days    Minutes of Exercise per Session: 60 min  Stress: Stress Concern Present (07/12/2023)   [employer] of Occupational Health - Occupational Stress Questionnaire    Feeling of Stress : To some extent  Social Connections: Socially Integrated (07/12/2023)   Social Connection and Isolation Panel [NHANES]    Frequency of Communication with Friends and Family: More than three times a week    Frequency of Social Gatherings with Friends and Family: More than three times a week    Attends Religious Services: More than 4 times per year    Active Member of [employer] or Organizations: Yes    Attends Engineer, structural: More than 4 times per year    Marital Status: Married    Tobacco Counseling Counseling given: Not Answered    Clinical Intake:  Pre-visit preparation completed: Yes  Pain : No/denies pain     BMI - recorded: 20.66 Nutritional Status: BMI of 19-24  Normal Nutritional Risks: None Diabetes: No  How often do you need to have someone help you when you read instructions, pamphlets, or other written materials from your doctor or pharmacy?: 1 - Never  Interpreter Needed?: No  Comments: lives with husband Information entered  by :: B.Bolton Canupp,LPN   Activities of Daily Living     07/12/2023   11:02 AM  In your present state of health, do you have any difficulty performing the following activities:  Hearing? 1  Vision? 0  Difficulty concentrating or making decisions? 0  Walking or climbing stairs? 0  Dressing or bathing? 0  Doing errands, shopping? 0  Preparing Food and eating ? N  Using the Toilet? N  In the past six months, have you accidently leaked urine? N  Do you have problems with loss of bowel control? N  Managing your Medications? N  Managing your Finances? N  Housekeeping or managing your Housekeeping? N  Patient Care Team: Karie Schwalbe, MD as PCP - General  Indicate any recent Medical Services you may have received from other than [facility] providers in the past year (date may be approximate).     Assessment:   This is a routine wellness examination for [facility].  Hearing/Vision screen Hearing Screening - Comments:: Pt says she quit wearing hearing aids because too magnified Vision Screening - Comments:: Pt says her vision is good w/contacts  Dr Dion Body   Goals Addressed             This Visit's Progress    Patient Stated       I would like to stay healthy and active       Depression Screen     07/12/2023   11:00 AM 08/15/2022   11:09 AM 08/15/2022   10:33 AM 08/03/2020   12:12 PM 07/31/2019   12:20 PM 05/16/2018   12:43 PM 05/22/2016    1:07 PM  PHQ 2/9 Scores  PHQ - 2 Score 0 0 0 0 0 0 0    Fall Risk     07/12/2023   10:46 AM 08/15/2022   11:09 AM 08/15/2022   10:33 AM 08/03/2020   12:12 PM 07/31/2019   12:20 PM  Fall Risk   Falls in the past year? 0 1 0 1 0  Number falls in past yr: 0 0 0 0   Injury with Fall? 0 0 0 0   Risk for fall due to : No Fall Risks  No Fall Risks    Follow up   Falls evaluation completed      MEDICARE RISK AT HOME:  Medicare Risk at Home Any stairs in or around the home?: Yes If so, are there any without handrails?: Yes Home free of  loose throw rugs in walkways, pet beds, electrical cords, etc?: Yes Adequate lighting in your home to reduce risk of falls?: Yes Life alert?: No Use of a cane, walker or w/c?: No Grab bars in the bathroom?: No Shower chair or bench in shower?: Yes Elevated toilet seat or a handicapped toilet?: Yes  TIMED UP AND GO:  Was the test performed?  No  Cognitive Function: 6CIT completed        07/12/2023   11:26 AM  6CIT Screen  What Year? 0 points  What month? 0 points  What time? 0 points  Count back from 20 0 points  Months in reverse 0 points  Repeat phrase 0 points  Total Score 0 points    Immunizations Immunization History  Administered Date(s) Administered   Influenza, High Dose Seasonal PF 03/06/2019, 02/26/2023   Influenza-Unspecified 07/01/2020   Moderna Covid-19 Fall Seasonal Vaccine 10yrs & older 02/26/2023   Moderna Sars-Covid-2 Vaccination 06/16/2019, 07/16/2019, 04/06/2020   Pneumococcal Conjugate-13 05/22/2016   Pneumococcal Polysaccharide-23 07/31/2019   Td 12/23/2007   Tdap 10/26/2019   Zoster Recombinant(Shingrix) 05/19/2018, 11/11/2018    Screening Tests Health Maintenance  Topic Date Due   Hepatitis C Screening  Never done   Colonoscopy  01/30/2023   Medicare Annual Wellness (AWV)  07/11/2024   MAMMOGRAM  12/10/2024   DTaP/Tdap/Td (3 - Td or Tdap) 10/25/2029   Pneumonia Vaccine 62+ Years old  Completed   INFLUENZA VACCINE  Completed   DEXA SCAN  Completed   COVID-19 Vaccine  Completed   Zoster Vaccines- Shingrix  Completed   HPV VACCINES  Aged Out    Health Maintenance  Health Maintenance Due  Topic Date Due  Hepatitis C Screening  Never done   Colonoscopy  01/30/2023   Health Maintenance Items Addressed: none needed at this time   Additional Screening:  Vision Screening: Recommended annual ophthalmology exams for early detection of glaucoma and other disorders of the eye.  Dental Screening: Recommended annual dental exams for proper  oral hygiene  Community Resource Referral / Chronic Care Management: CRR required this visit?  No   CCM required this visit?  No     Plan:     I have personally reviewed and noted the following in the patient's chart:   Medical and social history Use of alcohol, tobacco or illicit drugs  Current medications and supplements including opioid prescriptions. Patient is not currently taking opioid prescriptions. Functional ability and status Nutritional status Physical activity Advanced directives List of other physicians Hospitalizations, surgeries, and ER visits in previous 12 months Vitals Screenings to include cognitive, depression, and falls Referrals and appointments  In addition, I have reviewed and discussed with patient certain preventive protocols, quality metrics, and best practice recommendations. A written personalized care plan for preventive services as well as general preventive health recommendations were provided to patient.    Sue Lush, LPN   05/12/1094   After Visit Summary: (MyChart) Due to this being a telephonic visit, the after visit summary with patients personalized plan was offered to patient via MyChart   Notes:  Pt indicates she needs a refill of all her medications

## 2023-07-15 MED ORDER — ALPRAZOLAM 0.25 MG PO TABS: 0.2500 mg | ORAL_TABLET | Freq: Two times a day (BID) | ORAL | 0 refills | Status: AC | PRN

## 2023-07-15 MED ORDER — MELOXICAM 7.5 MG PO TABS: 7.5000 mg | ORAL_TABLET | Freq: Every day | ORAL | 1 refills | Status: DC

## 2023-07-15 NOTE — Addendum Note (Signed)
 Addended by: Eual Fines on: 07/15/2023 12:01 PM   Modules accepted: Orders

## 2023-07-15 NOTE — Addendum Note (Signed)
 Addended by: Tillman Abide I on: 07/15/2023 04:14 PM   Modules accepted: Orders

## 2023-07-15 NOTE — Telephone Encounter (Addendum)
 Alprazolam last filled 04-02-23 #20 Last OV 05-16-23 Next OV 08-19-23 [facility]

## 2023-07-15 NOTE — Addendum Note (Signed)
 Addended by: Eual Fines on: 07/15/2023 11:46 AM   Modules accepted: Orders

## 2023-07-16 NOTE — Telephone Encounter (Signed)
 Spoke to pt. There was a 2nd message taken 07-15-23 from pt calling back. It was not attached to this message.

## 2023-08-19 ENCOUNTER — Encounter: Payer: Self-pay | Admitting: Internal Medicine

## 2023-08-19 ENCOUNTER — Ambulatory Visit (INDEPENDENT_AMBULATORY_CARE_PROVIDER_SITE_OTHER): Payer: PPO | Admitting: Internal Medicine

## 2023-08-19 VITALS — BP 132/86 | HR 77 | Temp 97.8°F | Ht 66.5 in | Wt 131.0 lb

## 2023-08-19 DIAGNOSIS — F39 Unspecified mood [affective] disorder: Secondary | ICD-10-CM | POA: Diagnosis not present

## 2023-08-19 DIAGNOSIS — R7301 Impaired fasting glucose: Secondary | ICD-10-CM | POA: Insufficient documentation

## 2023-08-19 DIAGNOSIS — M150 Primary generalized (osteo)arthritis: Secondary | ICD-10-CM | POA: Diagnosis not present

## 2023-08-19 DIAGNOSIS — Z0000 Encounter for general adult medical examination without abnormal findings: Secondary | ICD-10-CM | POA: Insufficient documentation

## 2023-08-19 DIAGNOSIS — I10 Essential (primary) hypertension: Secondary | ICD-10-CM | POA: Diagnosis not present

## 2023-08-19 DIAGNOSIS — Z1159 Encounter for screening for other viral diseases: Secondary | ICD-10-CM

## 2023-08-19 DIAGNOSIS — E538 Deficiency of other specified B group vitamins: Secondary | ICD-10-CM | POA: Diagnosis not present

## 2023-08-19 LAB — COMPREHENSIVE METABOLIC PANEL WITH GFR
ALT: 17 U/L (ref 0–35)
AST: 18 U/L (ref 0–37)
Albumin: 4.8 g/dL (ref 3.5–5.2)
Alkaline Phosphatase: 62 U/L (ref 39–117)
BUN: 18 mg/dL (ref 6–23)
CO2: 28 meq/L (ref 19–32)
Calcium: 9.5 mg/dL (ref 8.4–10.5)
Chloride: 105 meq/L (ref 96–112)
Creatinine, Ser: 0.86 mg/dL (ref 0.40–1.20)
GFR: 67.04 mL/min (ref >60.00)
Glucose, Bld: 88 mg/dL (ref 70–99)
Potassium: 3.9 meq/L (ref 3.5–5.1)
Sodium: 142 meq/L (ref 135–145)
Total Bilirubin: 0.7 mg/dL (ref 0.2–1.2)
Total Protein: 6.8 g/dL (ref 6.0–8.3)

## 2023-08-19 LAB — VITAMIN B12: Vitamin B-12: 744 pg/mL (ref 211–911)

## 2023-08-19 LAB — LIPID PANEL
Cholesterol: 150 mg/dL (ref 0–200)
HDL: 52 mg/dL (ref >39.00)
LDL Cholesterol: 85 mg/dL (ref 0–99)
NonHDL: 97.68
Total CHOL/HDL Ratio: 3
Triglycerides: 65 mg/dL (ref 0.0–149.0)
VLDL: 13 mg/dL (ref 0.0–40.0)

## 2023-08-19 LAB — CBC
HCT: 42 % (ref 36.0–46.0)
Hemoglobin: 14.2 g/dL (ref 12.0–15.0)
MCHC: 33.9 g/dL (ref 30.0–36.0)
MCV: 88.1 fl (ref 78.0–100.0)
Platelets: 221 10*3/uL (ref 150.0–400.0)
RBC: 4.77 Mil/uL (ref 3.87–5.11)
RDW: 14.1 % (ref 11.5–15.5)
WBC: 5 10*3/uL (ref 4.0–10.5)

## 2023-08-19 LAB — TSH: TSH: 1.45 u[IU]/mL (ref 0.35–5.50)

## 2023-08-19 LAB — VITAMIN D 25 HYDROXY (VIT D DEFICIENCY, FRACTURES): VITD: 49.67 ng/mL (ref 30.00–100.00)

## 2023-08-19 LAB — HEMOGLOBIN A1C: Hgb A1c MFr Bld: 5.8 % (ref 4.6–6.5)

## 2023-08-19 MED ORDER — VALSARTAN-HYDROCHLOROTHIAZIDE 80-12.5 MG PO TABS: 0.5000 | ORAL_TABLET | Freq: Every day | ORAL | 3 refills | Status: DC

## 2023-08-19 NOTE — Assessment & Plan Note (Signed)
 Healthy Does exercise Needs one last screening colonoscopy next year (had one in 2021) Mammogram every other year--probably till 96 Flu and COVID vaccines in the fall

## 2023-08-19 NOTE — Assessment & Plan Note (Signed)
 BP Readings from Last 3 Encounters:  08/19/23 132/86  05/16/23 126/74  04/30/23 (!) 190/103   Doing fine on the 1/2 of the valsartan/hydrochlorothiazide 80/12.5

## 2023-08-19 NOTE — Assessment & Plan Note (Signed)
 Episodic anxiety/stress Uses the alprazolam rarely

## 2023-08-19 NOTE — Progress Notes (Signed)
 Hearing Screening - Comments:: Has hearing aids. Not wearing them today Vision Screening - Comments:: December 2024

## 2023-08-19 NOTE — Progress Notes (Signed)
 Subjective:    Patient ID: Catherine Strickland, female    DOB: December 25, 1950, 73 y.o.   MRN: [id number]  HPI Here for physical  No recurrence of palpitations Now on 1/2 of the BP medication Occasionally it is elevated--and she takes the full pill Average 125/72 for March Highest 149/102 in March. Highest in April 179/91---with kid related stress  Occasional sleep issues---trouble initiating or maintaining  Relates to ongoing stress with children--telling son about past marriage issues (with his father) Current marriage is 29 years and healthy And still "processing" a current issue with another child Uses the xanax very occasionally  Current Outpatient Medications on File Prior to Visit  Medication Sig Dispense Refill   ALPRAZolam (XANAX) 0.25 MG tablet Take 1 tablet (0.25 mg total) by mouth 2 (two) times daily as needed for anxiety. 20 tablet 0   cholecalciferol (VITAMIN D) 1000 UNITS tablet Take 1,000 Units by mouth daily.     Cyanocobalamin (VITAMIN B-12) 500 MCG SUBL Place 500 mcg under the tongue daily.     meloxicam (MOBIC) 7.5 MG tablet Take 1 tablet (7.5 mg total) by mouth daily. 30 tablet 1   valsartan-hydrochlorothiazide (DIOVAN-HCT) 80-12.5 MG tablet Take 1 tablet by mouth daily. 90 tablet 3   No current facility-administered medications on file prior to visit.    No Known Allergies  Past Medical History:  Diagnosis Date   Allergy    Anxiety    2024 occassionally   Arthritis    Knee and hand   GERD (gastroesophageal reflux disease) 2019    Past Surgical History:  Procedure Laterality Date   ABDOMINAL HYSTERECTOMY     HERNIA REPAIR  2022   UMBILICAL HERNIA REPAIR N/A 10/17/2020   Procedure: HERNIA REPAIR UMBILICAL ADULT;  Surgeon: Earline Mayotte, MD;  Location: [facility] ORS;  Service: General;  Laterality: N/A;  open umbilical hernia repair   UTERINE FIBROID SURGERY     VAGINAL DELIVERY     x5    Family History  Problem Relation Age of Onset   COPD Father     Cancer Father        lung   Cancer Mother        lung cancer   Diabetes Maternal Grandfather    Heart disease Maternal Grandmother    COPD Paternal Grandmother    Alcohol abuse Brother    Early death Brother    Alcohol abuse Daughter    Anxiety disorder Son    Cancer Sister    Early death Sister    Miscarriages / [place] Daughter    Breast cancer Neg Hx     Social History   Socioeconomic History   Marital status: Married    Spouse name: Not on file   Number of children: 5   Years of education: Not on file   Highest education level: Associate degree: occupational, Scientist, product/process development, or vocational program  Occupational History   Occupation: Catering manager    Comment: [employer]   Occupation: Musician    Comment: church  Tobacco Use   Smoking status: Never    Passive exposure: Past   Smokeless tobacco: Never  Vaping Use   Vaping status: Never Used  Substance and Sexual Activity   Alcohol use: Never   Drug use: Never   Sexual activity: Not Currently  Other Topics Concern   Not on file  Social History Narrative   Has living will   Husband is health care POA--alternate Armando Gang (daughter)   Would  accept resuscitation   No tube feeds if cognitively unaware   Social Drivers of Health   Financial Resource Strain: Low Risk  (07/12/2023)   Overall Financial Resource Strain (CARDIA)    Difficulty of Paying Living Expenses: Not hard at all  Food Insecurity: No Food Insecurity (07/12/2023)   Hunger Vital Sign    Worried About Running Out of Food in the Last Year: Never true    Ran Out of Food in the Last Year: Never true  Transportation Needs: No Transportation Needs (07/12/2023)   PRAPARE - Administrator, Civil Service (Medical): No    Lack of Transportation (Non-Medical): No  Physical Activity: Sufficiently Active (07/12/2023)   Exercise Vital Sign    Days of Exercise per Week: 5 days    Minutes of Exercise per Session: 60 min  Stress: Stress Concern  Present (07/12/2023)   [employer] of Occupational Health - Occupational Stress Questionnaire    Feeling of Stress : To some extent  Social Connections: Socially Integrated (07/12/2023)   Social Connection and Isolation Panel [NHANES]    Frequency of Communication with Friends and Family: More than three times a week    Frequency of Social Gatherings with Friends and Family: More than three times a week    Attends Religious Services: More than 4 times per year    Active Member of [employer] or Organizations: Yes    Attends Engineer, structural: More than 4 times per year    Marital Status: Married  Catering manager Violence: Not At Risk (07/12/2023)   Humiliation, Afraid, Rape, and Kick questionnaire    Fear of Current or Ex-Partner: No    Emotionally Abused: No    Physically Abused: No    Sexually Abused: No   Review of Systems  Constitutional:  Negative for fatigue and unexpected weight change.       Walks an hour most days No resistance other than yard/garden work Wears seat belt  HENT:  Positive for hearing loss. Negative for dental problem and tinnitus.        Keeps up with dentist  Eyes:  Negative for visual disturbance.       No diplopia or unilateral vision loss  Respiratory:  Negative for cough, chest tightness and shortness of breath.   Cardiovascular:  Negative for chest pain, palpitations and leg swelling.  Gastrointestinal:  Negative for blood in stool and constipation.       No heartburn--or rare  Endocrine: Negative for polydipsia and polyuria.  Genitourinary:  Negative for dyspareunia, dysuria and hematuria.  Musculoskeletal:  Negative for back pain and joint swelling.       Some left hip pain--affects walking at times. Diclofenac helps (occ for knee or feet)  Skin:  Negative for rash.       Due for derm check up  Allergic/Immunologic: Positive for environmental allergies. Negative for immunocompromised state.       Mild symptoms--no meds  Neurological:   Negative for dizziness, syncope, light-headedness and headaches.  Hematological:  Negative for adenopathy. Does not bruise/bleed easily.  Psychiatric/Behavioral:  Positive for sleep disturbance. Negative for dysphoric mood. The patient is nervous/anxious.        Objective:   Physical Exam Constitutional:      Appearance: Normal appearance.  HENT:     Mouth/Throat:     Pharynx: No oropharyngeal exudate or posterior oropharyngeal erythema.  Eyes:     Conjunctiva/sclera: Conjunctivae normal.     Pupils: Pupils are equal, round,  and reactive to light.  Cardiovascular:     Rate and Rhythm: Normal rate and regular rhythm.     Pulses: Normal pulses.     Heart sounds: No murmur heard.    No gallop.  Pulmonary:     Effort: Pulmonary effort is normal.     Breath sounds: Normal breath sounds. No wheezing or rales.  Abdominal:     Palpations: Abdomen is soft.     Tenderness: There is no abdominal tenderness.  Musculoskeletal:     Cervical back: Neck supple.     Right lower leg: No edema.     Left lower leg: No edema.  Lymphadenopathy:     Cervical: No cervical adenopathy.  Skin:    Findings: No lesion or rash.  Neurological:     General: No focal deficit present.     Mental Status: She is alert and oriented to person, place, and time.  Psychiatric:        Mood and Affect: Mood normal.        Behavior: Behavior normal.            Assessment & Plan:

## 2023-08-19 NOTE — Assessment & Plan Note (Signed)
 Will check labs

## 2023-08-19 NOTE — Assessment & Plan Note (Signed)
 Mild pain with left hip internal rotation Some other joints Uses the diclofenac

## 2023-08-20 LAB — HEPATITIS C ANTIBODY: Hepatitis C Ab: NONREACTIVE

## 2023-10-16 DIAGNOSIS — M1712 Unilateral primary osteoarthritis, left knee: Secondary | ICD-10-CM | POA: Diagnosis not present

## 2023-10-16 DIAGNOSIS — M5416 Radiculopathy, lumbar region: Secondary | ICD-10-CM | POA: Diagnosis not present

## 2023-10-16 DIAGNOSIS — M1811 Unilateral primary osteoarthritis of first carpometacarpal joint, right hand: Secondary | ICD-10-CM | POA: Diagnosis not present

## 2023-11-04 DIAGNOSIS — M545 Low back pain, unspecified: Secondary | ICD-10-CM | POA: Diagnosis not present

## 2023-11-13 DIAGNOSIS — M545 Low back pain, unspecified: Secondary | ICD-10-CM | POA: Diagnosis not present

## 2023-11-20 DIAGNOSIS — M545 Low back pain, unspecified: Secondary | ICD-10-CM | POA: Diagnosis not present

## 2023-11-26 ENCOUNTER — Other Ambulatory Visit: Payer: Self-pay | Admitting: Internal Medicine

## 2023-11-26 DIAGNOSIS — Z1231 Encounter for screening mammogram for malignant neoplasm of breast: Secondary | ICD-10-CM

## 2023-12-05 DIAGNOSIS — M545 Low back pain, unspecified: Secondary | ICD-10-CM | POA: Diagnosis not present

## 2023-12-10 DIAGNOSIS — M545 Low back pain, unspecified: Secondary | ICD-10-CM | POA: Diagnosis not present

## 2023-12-12 DIAGNOSIS — M545 Low back pain, unspecified: Secondary | ICD-10-CM | POA: Diagnosis not present

## 2023-12-13 ENCOUNTER — Ambulatory Visit
Admission: RE | Admit: 2023-12-13 | Discharge: 2023-12-13 | Disposition: A | Discharge status: Home / Self Care | Source: Ambulatory Visit | Attending: Internal Medicine | Admitting: Internal Medicine

## 2023-12-13 DIAGNOSIS — Z1231 Encounter for screening mammogram for malignant neoplasm of breast: Secondary | ICD-10-CM | POA: Insufficient documentation

## 2023-12-16 ENCOUNTER — Ambulatory Visit: Payer: Self-pay | Admitting: Internal Medicine

## 2023-12-17 DIAGNOSIS — M545 Low back pain, unspecified: Secondary | ICD-10-CM | POA: Diagnosis not present

## 2023-12-24 ENCOUNTER — Encounter: Payer: Self-pay | Admitting: Internal Medicine

## 2023-12-24 ENCOUNTER — Ambulatory Visit (INDEPENDENT_AMBULATORY_CARE_PROVIDER_SITE_OTHER): Admitting: Internal Medicine

## 2023-12-24 VITALS — BP 138/86 | HR 89 | Temp 100.1°F | Wt 131.0 lb

## 2023-12-24 DIAGNOSIS — R509 Fever, unspecified: Secondary | ICD-10-CM

## 2023-12-24 DIAGNOSIS — U071 COVID-19: Secondary | ICD-10-CM | POA: Insufficient documentation

## 2023-12-24 LAB — POC COVID19 BINAXNOW: SARS Coronavirus 2 Ag: POSITIVE — AB

## 2023-12-24 NOTE — Assessment & Plan Note (Signed)
 Mild infection Discussed paxlovid---will hold off due to the mild nature of this Analgesics ER if worsens If sinuses worsen next week, will Rx antibiotic (augmentin )

## 2023-12-24 NOTE — Progress Notes (Signed)
 Subjective:    Patient ID: Catherine Strickland, female    DOB: [birth year], 73 y.o.   MRN: [id number]  HPI Here due to respiratory illness  Sinus congestion --maxillary --started 16 days ago Had some dizziness with this Tried sudafed--didn't help Was on flonase--and then doubled it (and that helped)  Then husband got quite ill 3 days She got it yesterday---lunch/walking--then last night got sore throat, sinus pain (by left eye) Up all night last night---had mild chills (no sweats) Slight cough---some sputum from post nasal drip No SOB Slight ear pressure Did take mucus relief (mucinex)--may have helped No analgesics yet  Current Outpatient Medications on File Prior to Visit  Medication Sig Dispense Refill   ALPRAZolam  (XANAX ) 0.25 MG tablet Take 1 tablet (0.25 mg total) by mouth 2 (two) times daily as needed for anxiety. 20 tablet 0   cholecalciferol (VITAMIN D ) 1000 UNITS tablet Take 1,000 Units by mouth daily.     Cyanocobalamin  (VITAMIN B-12) 500 MCG SUBL Place 500 mcg under the tongue daily.     meloxicam  (MOBIC ) 7.5 MG tablet Take 1 tablet (7.5 mg total) by mouth daily. 30 tablet 1   valsartan -hydrochlorothiazide  (DIOVAN -HCT) 80-12.5 MG tablet Take 0.5 tablets by mouth daily. 1 tablet 3   No current facility-administered medications on file prior to visit.    No Known Allergies  Past Medical History:  Diagnosis Date   Allergy    Anxiety    2024 occassionally   Arthritis    Knee and hand   GERD (gastroesophageal reflux disease) 2019    Past Surgical History:  Procedure Laterality Date   ABDOMINAL HYSTERECTOMY     HERNIA REPAIR  2022   UMBILICAL HERNIA REPAIR N/A 10/17/2020   Procedure: HERNIA REPAIR UMBILICAL ADULT;  Surgeon: Dessa Reyes ORN, MD;  Location: [facility] ORS;  Service: General;  Laterality: N/A;  open umbilical hernia repair   UTERINE FIBROID SURGERY     VAGINAL DELIVERY     x5    Family History  Problem Relation Age of Onset   COPD Father     Cancer Father        lung   Cancer Mother        lung cancer   Diabetes Maternal Grandfather    Heart disease Maternal Grandmother    COPD Paternal Grandmother    Alcohol abuse Brother    Early death Brother    Alcohol abuse Daughter    Anxiety disorder Son    Cancer Sister    Early death Sister    Miscarriages / [place] Daughter    Breast cancer Neg Hx     Social History   Socioeconomic History   Marital status: Married    Spouse name: Not on file   Number of children: 5   Years of education: Not on file   Highest education level: Associate degree: occupational, Scientist, product/process development, or vocational program  Occupational History   Occupation: Catering manager    Comment: [employer]   Occupation: Musician    Comment: church  Tobacco Use   Smoking status: Never    Passive exposure: Past   Smokeless tobacco: Never  Vaping Use   Vaping status: Never Used  Substance and Sexual Activity   Alcohol use: Never   Drug use: Never   Sexual activity: Not Currently  Other Topics Concern   Not on file  Social History Narrative   Has living will   Husband is health care POA--alternate Jon Groom (daughter)  Would accept resuscitation   No tube feeds if cognitively unaware   Social Drivers of Health   Financial Resource Strain: Low Risk  (07/12/2023)   Overall Financial Resource Strain (CARDIA)    Difficulty of Paying Living Expenses: Not hard at all  Food Insecurity: No Food Insecurity (07/12/2023)   Hunger Vital Sign    Worried About Running Out of Food in the Last Year: Never true    Ran Out of Food in the Last Year: Never true  Transportation Needs: No Transportation Needs (07/12/2023)   PRAPARE - Administrator, Civil Service (Medical): No    Lack of Transportation (Non-Medical): No  Physical Activity: Sufficiently Active (07/12/2023)   Exercise Vital Sign    Days of Exercise per Week: 5 days    Minutes of Exercise per Session: 60 min  Stress: Stress Concern  Present (07/12/2023)   [employer] of Occupational Health - Occupational Stress Questionnaire    Feeling of Stress : To some extent  Social Connections: Socially Integrated (07/12/2023)   Social Connection and Isolation Panel    Frequency of Communication with Friends and Family: More than three times a week    Frequency of Social Gatherings with Friends and Family: More than three times a week    Attends Religious Services: More than 4 times per year    Active Member of [employer] or Organizations: Yes    Attends Engineer, structural: More than 4 times per year    Marital Status: Married  Catering manager Violence: Not At Risk (07/12/2023)   Humiliation, Afraid, Rape, and Kick questionnaire    Fear of Current or Ex-Partner: No    Emotionally Abused: No    Physically Abused: No    Sexually Abused: No   Review of Systems No loss of smell or taste No N/V Able to eat    Objective:   Physical Exam Constitutional:      General: She is not in acute distress.    Comments: Looks okay  HENT:     Head:     Comments: No sinus tenderness    Right Ear: Tympanic membrane and ear canal normal.     Left Ear: Tympanic membrane and ear canal normal.     Mouth/Throat:     Comments: Mild pharyngeal injection Pulmonary:     Effort: Pulmonary effort is normal.     Breath sounds: Normal breath sounds. No wheezing or rales.  Musculoskeletal:     Cervical back: Neck supple.  Lymphadenopathy:     Cervical: No cervical adenopathy.  Neurological:     Mental Status: She is alert.            Assessment & Plan:

## 2023-12-30 ENCOUNTER — Encounter: Payer: Self-pay | Admitting: Internal Medicine

## 2023-12-30 MED ORDER — AMOXICILLIN-POT CLAVULANATE 875-125 MG PO TABS
1.0000 | ORAL_TABLET | Freq: Two times a day (BID) | ORAL | 0 refills | Status: DC
Start: 2023-12-30 — End: 2024-03-11

## 2024-01-01 DIAGNOSIS — M545 Low back pain, unspecified: Secondary | ICD-10-CM | POA: Diagnosis not present

## 2024-01-15 DIAGNOSIS — M545 Low back pain, unspecified: Secondary | ICD-10-CM | POA: Diagnosis not present

## 2024-02-24 DIAGNOSIS — M545 Low back pain, unspecified: Secondary | ICD-10-CM | POA: Diagnosis not present

## 2024-02-26 DIAGNOSIS — L308 Other specified dermatitis: Secondary | ICD-10-CM | POA: Diagnosis not present

## 2024-02-26 DIAGNOSIS — D2262 Melanocytic nevi of left upper limb, including shoulder: Secondary | ICD-10-CM | POA: Diagnosis not present

## 2024-02-26 DIAGNOSIS — D485 Neoplasm of uncertain behavior of skin: Secondary | ICD-10-CM | POA: Diagnosis not present

## 2024-02-26 DIAGNOSIS — D2271 Melanocytic nevi of right lower limb, including hip: Secondary | ICD-10-CM | POA: Diagnosis not present

## 2024-02-26 DIAGNOSIS — D0472 Carcinoma in situ of skin of left lower limb, including hip: Secondary | ICD-10-CM | POA: Diagnosis not present

## 2024-02-26 DIAGNOSIS — L309 Dermatitis, unspecified: Secondary | ICD-10-CM | POA: Diagnosis not present

## 2024-02-26 DIAGNOSIS — D225 Melanocytic nevi of trunk: Secondary | ICD-10-CM | POA: Diagnosis not present

## 2024-02-26 DIAGNOSIS — D2272 Melanocytic nevi of left lower limb, including hip: Secondary | ICD-10-CM | POA: Diagnosis not present

## 2024-02-26 DIAGNOSIS — L821 Other seborrheic keratosis: Secondary | ICD-10-CM | POA: Diagnosis not present

## 2024-02-26 DIAGNOSIS — D2261 Melanocytic nevi of right upper limb, including shoulder: Secondary | ICD-10-CM | POA: Diagnosis not present

## 2024-03-09 DIAGNOSIS — M545 Low back pain, unspecified: Secondary | ICD-10-CM | POA: Diagnosis not present

## 2024-03-11 ENCOUNTER — Encounter: Payer: Self-pay | Admitting: Family Medicine

## 2024-03-11 ENCOUNTER — Ambulatory Visit: Admitting: Family Medicine

## 2024-03-11 VITALS — BP 126/78 | HR 72 | Temp 98.3°F | Ht 66.5 in | Wt 134.5 lb

## 2024-03-11 DIAGNOSIS — R599 Enlarged lymph nodes, unspecified: Secondary | ICD-10-CM | POA: Insufficient documentation

## 2024-03-11 DIAGNOSIS — M159 Polyosteoarthritis, unspecified: Secondary | ICD-10-CM

## 2024-03-11 DIAGNOSIS — L309 Dermatitis, unspecified: Secondary | ICD-10-CM | POA: Insufficient documentation

## 2024-03-11 MED ORDER — MELOXICAM 7.5 MG PO TABS: 7.5000 mg | ORAL_TABLET | Freq: Every day | ORAL | 2 refills | Status: AC | PRN

## 2024-03-11 NOTE — Progress Notes (Signed)
 Subjective:    Patient ID: Catherine Strickland, female    DOB: 1950/11/18, 73 y.o.   MRN: [id number]  HPI  Wt Readings from Last 3 Encounters:  03/11/24 134 lb 8 oz (61 kg)  12/24/23 131 lb (59.4 kg)  08/19/23 131 lb (59.4 kg)   21.38 kg/m  Vitals:   03/11/24 1227  BP: 126/78  Pulse: 72  Temp: 98.3 F (36.8 C)  SpO2: 99%    73 yo pt of Dr Jimmy with c/o  Knot on her neck  Also needs a refill of meloxicam  (waiting to est care with Dr Bennett)  First noticed it about 2 weeks ago    Has dermatitis in scalp   (dx from derm)- uses sarna , also prescription for scalp  Uses desitin baby ointment also   Was scratching and felt knot  Not sore to touch  Does not feel hot      Has history of OA several areas  Takes meloxicam  7.5 mg daily as needed -when working outside  Back and body pain   Lab Results  Component Value Date   NA 142 08/19/2023   K 3.9 08/19/2023   CO2 28 08/19/2023   GLUCOSE 88 08/19/2023   BUN 18 08/19/2023   CREATININE 0.86 08/19/2023   CALCIUM 9.5 08/19/2023   GFR 67.04 08/19/2023   GFRNONAA 73 05/22/2016   Lab Results  Component Value Date   WBC 5.0 08/19/2023   HGB 14.2 08/19/2023   HCT 42.0 08/19/2023   MCV 88.1 08/19/2023   PLT 221.0 08/19/2023      Patient Active Problem List   Diagnosis Date Noted   Dermatitis 03/11/2024   Swollen lymph nodes 03/11/2024   COVID-19 virus infection 12/24/2023   Preventative health care 08/19/2023   Elevated fasting glucose 08/19/2023   Essential hypertension, benign 04/30/2023   Mood disorder 12/04/2022   Osteoarthritis, multiple sites 08/15/2022   Osteoarthritis, hand 07/20/2020   Advance directive discussed with patient 05/16/2018   Vitamin B12 deficiency 11/22/2009   WRIST PAIN, RIGHT, CHRONIC 09/02/2008   Palpitations 12/23/2007   Past Medical History:  Diagnosis Date   Allergy    Anxiety    2024 occassionally   Arthritis    Knee and hand   GERD (gastroesophageal reflux disease)  2019   Past Surgical History:  Procedure Laterality Date   ABDOMINAL HYSTERECTOMY     HERNIA REPAIR  2022   UMBILICAL HERNIA REPAIR N/A 10/17/2020   Procedure: HERNIA REPAIR UMBILICAL ADULT;  Surgeon: Dessa Reyes ORN, MD;  Location: [facility] ORS;  Service: General;  Laterality: N/A;  open umbilical hernia repair   UTERINE FIBROID SURGERY     VAGINAL DELIVERY     x5   Social History   Tobacco Use   Smoking status: Never    Passive exposure: Past   Smokeless tobacco: Never  Vaping Use   Vaping status: Never Used  Substance Use Topics   Alcohol use: Never   Drug use: Never   Family History  Problem Relation Age of Onset   COPD Father    Cancer Father        lung   Cancer Mother        lung cancer   Diabetes Maternal Grandfather    Heart disease Maternal Grandmother    COPD Paternal Grandmother    Alcohol abuse Brother    Early death Brother    Alcohol abuse Daughter    Anxiety disorder Son    Cancer  Sister    Early death Sister    Miscarriages / Stillbirths Daughter    Breast cancer Neg Hx    No Known Allergies Current Outpatient Medications on File Prior to Visit  Medication Sig Dispense Refill   ALPRAZolam  (XANAX ) 0.25 MG tablet Take 1 tablet (0.25 mg total) by mouth 2 (two) times daily as needed for anxiety. 20 tablet 0   cholecalciferol (VITAMIN D ) 1000 UNITS tablet Take 1,000 Units by mouth daily.     Cyanocobalamin  (VITAMIN B-12) 500 MCG SUBL Place 500 mcg under the tongue daily.     valsartan -hydrochlorothiazide  (DIOVAN -HCT) 80-12.5 MG tablet Take 0.5 tablets by mouth daily. 1 tablet 3   No current facility-administered medications on file prior to visit.    Review of Systems  Constitutional:  Negative for activity change, appetite change, fatigue, fever and unexpected weight change.  HENT:  Negative for congestion, ear pain, rhinorrhea, sinus pressure and sore throat.   Eyes:  Negative for pain, redness and visual disturbance.  Respiratory:  Negative for  cough, shortness of breath and wheezing.   Cardiovascular:  Negative for chest pain and palpitations.  Gastrointestinal:  Negative for abdominal pain, blood in stool, constipation and diarrhea.  Endocrine: Negative for polydipsia and polyuria.  Genitourinary:  Negative for dysuria, frequency and urgency.  Musculoskeletal:  Negative for arthralgias, back pain and myalgias.  Skin:  Positive for rash. Negative for pallor and wound.  Allergic/Immunologic: Negative for environmental allergies.  Neurological:  Negative for dizziness, syncope and headaches.  Hematological:  Negative for adenopathy. Does not bruise/bleed easily.  Psychiatric/Behavioral:  Negative for decreased concentration and dysphoric mood. The patient is not nervous/anxious.        Objective:   Physical Exam Constitutional:      General: She is not in acute distress.    Appearance: Normal appearance. She is well-developed and normal weight. She is not ill-appearing or diaphoretic.  HENT:     Head: Normocephalic and atraumatic.     Right Ear: Tympanic membrane and ear canal normal.     Left Ear: Tympanic membrane and ear canal normal.     Nose: Nose normal.     Mouth/Throat:     Mouth: Mucous membranes are moist.     Pharynx: Oropharynx is clear.  Eyes:     General: No scleral icterus.       Right eye: No discharge.        Left eye: No discharge.     Conjunctiva/sclera: Conjunctivae normal.     Pupils: Pupils are equal, round, and reactive to light.  Neck:     Thyroid : No thyromegaly.     Vascular: No carotid bruit or JVD.     Comments: Single lump in left occipital area consistent with swollen LN [place] (slightly) Non tender  No erythema or skin breakdown Cardiovascular:     Rate and Rhythm: Normal rate and regular rhythm.     Heart sounds: Normal heart sounds.     No gallop.  Pulmonary:     Effort: Pulmonary effort is normal. No respiratory distress.     Breath sounds: Normal breath sounds. No wheezing or  rales.  Abdominal:     General: There is no distension or abdominal bruit.     Palpations: Abdomen is soft.  Musculoskeletal:     Cervical back: Normal range of motion and neck supple.     Right lower leg: No edema.     Left lower leg: No edema.  Lymphadenopathy:  Head:     Right side of head: No submental, submandibular or preauricular adenopathy.     Left side of head: Occipital adenopathy present. No submental, submandibular or preauricular adenopathy.     Cervical: No cervical adenopathy.     Right cervical: No superficial cervical adenopathy.    Left cervical: No superficial cervical adenopathy.     Upper Body:     Right upper body: No supraclavicular or axillary adenopathy.     Left upper body: No supraclavicular or axillary adenopathy.  Skin:    General: Skin is warm and dry.     Coloration: Skin is not pale.     Findings: No rash.     Comments: Diffuse rash right torso - dry/ bumpy with scale Similar on back of scalp with some pink areas and excoriations   Neurological:     Mental Status: She is alert.     Cranial Nerves: No cranial nerve deficit.     Coordination: Coordination normal.     Deep Tendon Reflexes: Reflexes are normal and symmetric. Reflexes normal.  Psychiatric:        Mood and Affect: Mood normal.           Assessment & Plan:   Problem List Items Addressed This Visit       Musculoskeletal and Integument   Osteoarthritis, multiple sites   Refilled meloxicam  7.5 mg to use prn with food for back or body pain  Last renal labs normal      Relevant Medications   meloxicam  (MOBIC ) 7.5 MG tablet   Dermatitis   Body and scalp Pt sees derm Dr Elvira on topical treatment and antihistamine Pt is unsure what dx name is   Scratching of scalp may have lead to inflamed LN         Immune and Lymphatic   Swollen lymph nodes - Primary   Lump near left occiput resembles solitary swollen LN Suspect this is due to scratching/dermatitis in back of  scalp No other adenopathy or findings  Discussed this in length  Encouraged to continue treating the dermatitis and avoid scratching if possible (reach out to dermatology if needed)   Reviewed last labs/reassuring  Will observe  If LN does not decrease in size (or gets worse) in next month instructed to call and let us  know

## 2024-03-11 NOTE — Assessment & Plan Note (Signed)
 Lump near left occiput resembles solitary swollen LN Suspect this is due to scratching/dermatitis in back of scalp No other adenopathy or findings  Discussed this in length  Encouraged to continue treating the dermatitis and avoid scratching if possible (reach out to dermatology if needed)   Reviewed last labs/reassuring  Will observe  If LN does not decrease in size (or gets worse) in next month instructed to call and let us  know

## 2024-03-11 NOTE — Patient Instructions (Signed)
 Keep treating the dermatitis with topical medicines and antihistamines   I think the lump is an inflamed lymph node (reactive to the dermatitis and scratching)   It if increases in size or gets painful- please call  You can try a warm compress   Give it a month  If not improved/smaller or gone let us  know   Also let the dermatologist know

## 2024-03-11 NOTE — Assessment & Plan Note (Signed)
 Body and scalp Pt sees derm Dr Elvira on topical treatment and antihistamine Pt is unsure what dx name is   Scratching of scalp may have lead to inflamed LN

## 2024-03-11 NOTE — Assessment & Plan Note (Signed)
 Refilled meloxicam  7.5 mg to use prn with food for back or body pain  Last renal labs normal

## 2024-03-17 ENCOUNTER — Encounter: Payer: Self-pay | Admitting: Pharmacist

## 2024-03-17 NOTE — Progress Notes (Signed)
 Pharmacy Quality Measure Review  This patient is appearing on a report for being at risk of failing the adherence measure for hypertension (ACEi/ARB) medications this calendar year.   Medication: valsartan /hydrochlorothiazide  80/12.5 mg Last fill date: 10/31/23 for 90 day supply  Refill due Sept, overdue  MyChart message sent to patient. Will confirm patient is still taking.  Likely in need of medication refill.   Prior prescriber = [place].  Is scheduled in April 2026 for [facility] with [facility].

## 2024-03-18 ENCOUNTER — Other Ambulatory Visit: Payer: Self-pay

## 2024-03-18 MED ORDER — VALSARTAN-HYDROCHLOROTHIAZIDE 80-12.5 MG PO TABS: 0.5000 | ORAL_TABLET | Freq: Every day | ORAL | 0 refills | Status: AC

## 2024-03-18 NOTE — Progress Notes (Signed)
 Sent BP med

## 2024-03-18 NOTE — Telephone Encounter (Signed)
 Sent as requested.

## 2024-03-25 DIAGNOSIS — M545 Low back pain, unspecified: Secondary | ICD-10-CM | POA: Diagnosis not present

## 2024-03-30 ENCOUNTER — Ambulatory Visit: Payer: Self-pay

## 2024-03-30 NOTE — Telephone Encounter (Signed)
 FYI Only or Action Required?: FYI only for provider: appointment scheduled on 11.26.25.  Patient was last seen in primary care on 03/11/2024 by Randeen Laine LABOR, MD.  Called Nurse Triage reporting Cough.  Symptoms began a week ago.  Interventions attempted: OTC medications: allegra, mucinex, flonase.  Symptoms are: gradually worsening.  Triage Disposition: See PCP When Office is Open (Within 3 Days)  Patient/caregiver understands and will follow disposition?: Yes  Copied from CRM #[id number]. Topic: Clinical - Red Word Triage >> Mar 30, 2024  9:56 AM Gustabo D wrote: Pt is coughing green mucus and she's hoarse and needs to be able to sing in 2 weeks, she has mild congestion. And doesn't want it to get worse.Been going on a week. Reason for Disposition  [1] Nasal discharge AND [2] present > 10 days  Answer Assessment - Initial Assessment Questions Pt states this has been going on about a week but is getting a little worse. She states she has been doing yard work and other things outside and there has been lots of dust. Last week it started with sinus congestion and nasal and she was having some light green phlegm that was thinner. Now she has a cough that she is coughing up green mucus worse in the mornings, thick, and sometimes they are the size of the head of a qtip. She states her concern is there have been changes to her voice and she is supposed to sing in 2 weeks. She states that she tried to sing last night at choir practice and it threw her into a coughing fit. She denies any wheezing, shortness of breath, chest pain, or fever. States she has been taking mucinex the last two night and is taking a generic for allegra.     1. ONSET: When did the cough begin?      About a week ago 2. SEVERITY: How bad is the cough today?      moderate 3. SPUTUM: Describe the color of your sputum (e.g., none, dry cough; clear, white, yellow, green)     green 4. DIFFICULTY BREATHING: Are you  having difficulty breathing? If Yes, ask: How bad is it? (e.g., mild, moderate, severe)      denies 5. FEVER: Do you have a fever? If Yes, ask: What is your temperature, how was it measured, and when did it start?     denies 6. CARDIAC HISTORY: Do you have any history of heart disease? (e.g., heart attack, congestive heart failure)      htn 7. LUNG HISTORY: Do you have any history of lung disease?  (e.g., pulmonary embolus, asthma, emphysema)     no 8. OTHER SYMPTOMS: Do you have any other symptoms? (e.g., runny nose, wheezing, chest pain)       denies  Protocols used: Cough - Acute Productive-A-AH

## 2024-03-30 NOTE — Telephone Encounter (Signed)
 Will see patient then Agree with ER and UC precautions

## 2024-04-01 ENCOUNTER — Ambulatory Visit (INDEPENDENT_AMBULATORY_CARE_PROVIDER_SITE_OTHER): Admitting: Family Medicine

## 2024-04-01 ENCOUNTER — Encounter: Payer: Self-pay | Admitting: Family Medicine

## 2024-04-01 VITALS — BP 126/78 | HR 69 | Temp 98.0°F | Ht 66.5 in | Wt 134.4 lb

## 2024-04-01 DIAGNOSIS — R599 Enlarged lymph nodes, unspecified: Secondary | ICD-10-CM

## 2024-04-01 DIAGNOSIS — J069 Acute upper respiratory infection, unspecified: Secondary | ICD-10-CM | POA: Insufficient documentation

## 2024-04-01 MED ORDER — DOXYCYCLINE HYCLATE 100 MG PO TABS
100.0000 mg | ORAL_TABLET | Freq: Two times a day (BID) | ORAL | 0 refills | Status: AC
Start: 2024-04-01 — End: ?

## 2024-04-01 NOTE — Assessment & Plan Note (Signed)
 Day 10 uri with cough and congestion Reassuring exam   Disc symptomatic care - see instructions on AVS  Suspect viral but given length of illness and upcoming long weekend prescription doxycycline  given to fill if symptoms worsen or fail to improve  Update if not starting to improve in a week or if worsening  Call back and Er precautions noted in detail today

## 2024-04-01 NOTE — Assessment & Plan Note (Signed)
 LN near left occiput is significantly decreased in size

## 2024-04-01 NOTE — Patient Instructions (Addendum)
 Drink fluids and rest  mucinex DM is good for cough and congestion  Nasal saline for congestion as needed  Tylenol  for fever or pain or headache  Please alert us  if symptoms worsen (if severe or short of breath please go to the ER)   If any symptoms worsen over the weekend - fill the prescription for doxycycline  and let us  know    Look at the info on coronary calcium scan and you can discuss it with Dr Bennett when you see her

## 2024-04-01 NOTE — Progress Notes (Signed)
 Subjective:    Patient ID: Catherine Strickland, female    DOB: 1950-09-10, 73 y.o.   MRN: [id number]  HPI  Wt Readings from Last 3 Encounters:  04/01/24 134 lb 6 oz (61 kg)  03/11/24 134 lb 8 oz (61 kg)  12/24/23 131 lb (59.4 kg)   21.36 kg/m  Vitals:   04/01/24 1028  BP: 126/78  Pulse: 69  Temp: 98 F (36.7 C)  SpO2: 98%   73 yo pt of Dr Jimmy presents for  Cough and congestion  10 days  Also has questions about coronary calcium scan for screening   Hoarse voice  No ST Ears are ok  Productive cough- green phlegm (worse in am)  No wheezing or shortness of breath  Some nasal congestion /not bad  No fever  No chills  No headache    Over the counter Mucinex plain  Allegra   Did not do covid test Had covid in August   Lymph node on neck did go down     Patient Active Problem List   Diagnosis Date Noted   Viral URI with cough 04/01/2024   Dermatitis 03/11/2024   Swollen lymph nodes 03/11/2024   Preventative health care 08/19/2023   Elevated fasting glucose 08/19/2023   Essential hypertension, benign 04/30/2023   Mood disorder 12/04/2022   Osteoarthritis, multiple sites 08/15/2022   Osteoarthritis, hand 07/20/2020   Advance directive discussed with patient 05/16/2018   Vitamin B12 deficiency 11/22/2009   WRIST PAIN, RIGHT, CHRONIC 09/02/2008   Palpitations 12/23/2007   Past Medical History:  Diagnosis Date   Allergy    Anxiety    2024 occassionally   Arthritis    Knee and hand   GERD (gastroesophageal reflux disease) 2019   Past Surgical History:  Procedure Laterality Date   ABDOMINAL HYSTERECTOMY     HERNIA REPAIR  2022   UMBILICAL HERNIA REPAIR N/A 10/17/2020   Procedure: HERNIA REPAIR UMBILICAL ADULT;  Surgeon: Dessa Reyes ORN, MD;  Location: [facility] ORS;  Service: General;  Laterality: N/A;  open umbilical hernia repair   UTERINE FIBROID SURGERY     VAGINAL DELIVERY     x5   Social History   Tobacco Use   Smoking status: Never     Passive exposure: Past   Smokeless tobacco: Never  Vaping Use   Vaping status: Never Used  Substance Use Topics   Alcohol use: Never   Drug use: Never   Family History  Problem Relation Age of Onset   COPD Father    Cancer Father        lung   Cancer Mother        lung cancer   Diabetes Maternal Grandfather    Heart disease Maternal Grandmother    COPD Paternal Grandmother    Alcohol abuse Brother    Early death Brother    Alcohol abuse Daughter    Anxiety disorder Son    Cancer Sister    Early death Sister    Miscarriages / Stillbirths Daughter    Breast cancer Neg Hx    No Known Allergies Current Outpatient Medications on File Prior to Visit  Medication Sig Dispense Refill   ALPRAZolam  (XANAX ) 0.25 MG tablet Take 1 tablet (0.25 mg total) by mouth 2 (two) times daily as needed for anxiety. 20 tablet 0   cholecalciferol (VITAMIN D ) 1000 UNITS tablet Take 1,000 Units by mouth daily.     Cyanocobalamin  (VITAMIN B-12) 500 MCG SUBL Place 500 mcg under  the tongue daily.     meloxicam  (MOBIC ) 7.5 MG tablet Take 1 tablet (7.5 mg total) by mouth daily as needed for pain (back or body pain). With food 30 tablet 2   valsartan -hydrochlorothiazide  (DIOVAN -HCT) 80-12.5 MG tablet Take 0.5 tablets by mouth daily. 45 tablet 0   No current facility-administered medications on file prior to visit.    Review of Systems  Constitutional:  Positive for fatigue. Negative for appetite change and fever.  HENT:  Positive for congestion, postnasal drip, rhinorrhea, sinus pressure, sneezing, sore throat and voice change. Negative for ear pain.   Eyes:  Negative for pain and discharge.  Respiratory:  Positive for cough. Negative for shortness of breath, wheezing and stridor.   Cardiovascular:  Negative for chest pain.  Gastrointestinal:  Negative for diarrhea, nausea and vomiting.  Genitourinary:  Negative for frequency, hematuria and urgency.  Musculoskeletal:  Negative for arthralgias and  myalgias.  Skin:  Negative for rash.  Neurological:  Negative for dizziness, weakness, light-headedness and headaches.  Psychiatric/Behavioral:  Negative for confusion and dysphoric mood.        Objective:   Physical Exam Constitutional:      General: She is not in acute distress.    Appearance: Normal appearance. She is well-developed and normal weight. She is not ill-appearing, toxic-appearing or diaphoretic.  HENT:     Head: Normocephalic and atraumatic.     Comments: Nares are injected and congested      Right Ear: Tympanic membrane, ear canal and external ear normal.     Left Ear: Tympanic membrane, ear canal and external ear normal.     Nose: Congestion and rhinorrhea present.     Mouth/Throat:     Mouth: Mucous membranes are moist.     Pharynx: Oropharynx is clear. No oropharyngeal exudate or posterior oropharyngeal erythema.     Comments: Clear pnd  Eyes:     General:        Right eye: No discharge.        Left eye: No discharge.     Conjunctiva/sclera: Conjunctivae normal.     Pupils: Pupils are equal, round, and reactive to light.  Cardiovascular:     Rate and Rhythm: Normal rate.     Heart sounds: Normal heart sounds.  Pulmonary:     Effort: Pulmonary effort is normal. No respiratory distress.     Breath sounds: Normal breath sounds. No stridor. No wheezing, rhonchi or rales.     Comments: Harsh bs Good air exch Few upper airway sounds  Chest:     Chest wall: No tenderness.  Musculoskeletal:     Cervical back: Normal range of motion and neck supple.  Lymphadenopathy:     Cervical: No cervical adenopathy.  Skin:    General: Skin is warm and dry.     Capillary Refill: Capillary refill takes less than 2 seconds.     Findings: No rash.  Neurological:     Mental Status: She is alert.     Cranial Nerves: No cranial nerve deficit.  Psychiatric:        Mood and Affect: Mood normal.           Assessment & Plan:   Problem List Items Addressed This Visit        Respiratory   Viral URI with cough - Primary   Day 10 uri with cough and congestion Reassuring exam   Disc symptomatic care - see instructions on AVS  Suspect viral but given length of  illness and upcoming long weekend prescription doxycycline  given to fill if symptoms worsen or fail to improve  Update if not starting to improve in a week or if worsening  Call back and Er precautions noted in detail today          Immune and Lymphatic   Swollen lymph nodes   LN near left occiput is significantly decreased in size

## 2024-04-05 NOTE — Progress Notes (Unsigned)
 Cardiology Office Note  Date:  04/06/2024   ID:  Catherine Strickland, DOB [birth year], MRN [id number]  PCP:  Jimmy Charlie FERNS, MD   Chief Complaint  Patient presents with   New Patient (Initial Visit)    Self referral for twinges in chest mostly at rest, tiredness and a lack of energy with occasional irregular heartbeats.     HPI:  Catherine Strickland is a 73 y.o. female with past medical history of: Past Medical History:  Diagnosis Date   Allergy    Anxiety    2024 occassionally   Arthritis    Knee and hand   GERD (gastroesophageal reflux disease) 2019  Who presents by referral from Dr. Randeen for hypertension, twinges in chest mostly at rest, tiredness and a lack of energy with occasional irregular heartbeats  Was started on BP meds Water fast to fix pressure last year Got dehydrated after 4 days, heart beat funny Hx of panic attack  Active 3-4 x a week, walks 1 hour Busy in yard  Twinges in chest at night in bed, Quick, goes away without intervention  Labs reviewed A1C 5.8 Total chol 150, LDL 85  Nonsmoker Good cholesterol No diabetes  EKG personally reviewed by myself on todays visit EKG Interpretation Date/Time:  Monday April 06 2024 08:52:49 EST Ventricular Rate:  80 PR Interval:  142 QRS Duration:  72 QT Interval:  390 QTC Calculation: 449 R Axis:   15  Text Interpretation: Normal sinus rhythm Normal ECG When compared with ECG of 13-Oct-2020 11:05, No significant change was found Confirmed by Perla Lye [phone]) on 04/06/2024 8:58:03 AM    PMH:   has a past medical history of Allergy, Anxiety, Arthritis, and GERD (gastroesophageal reflux disease) (2019).   PSH:    Past Surgical History:  Procedure Laterality Date   ABDOMINAL HYSTERECTOMY     HERNIA REPAIR  2022   UMBILICAL HERNIA REPAIR N/A 10/17/2020   Procedure: HERNIA REPAIR UMBILICAL ADULT;  Surgeon: Dessa Reyes ORN, MD;  Location: [facility] ORS;  Service: General;  Laterality: N/A;  open  umbilical hernia repair   UTERINE FIBROID SURGERY     VAGINAL DELIVERY     x5    Current Outpatient Medications  Medication Sig Dispense Refill   ALPRAZolam  (XANAX ) 0.25 MG tablet Take 1 tablet (0.25 mg total) by mouth 2 (two) times daily as needed for anxiety. 20 tablet 0   cholecalciferol (VITAMIN D ) 1000 UNITS tablet Take 1,000 Units by mouth daily.     Cyanocobalamin  (VITAMIN B-12) 500 MCG SUBL Place 500 mcg under the tongue daily.     meloxicam  (MOBIC ) 7.5 MG tablet Take 1 tablet (7.5 mg total) by mouth daily as needed for pain (back or body pain). With food 30 tablet 2   valsartan -hydrochlorothiazide  (DIOVAN -HCT) 80-12.5 MG tablet Take 0.5 tablets by mouth daily. 45 tablet 0   doxycycline  (VIBRA -TABS) 100 MG tablet Take 1 tablet (100 mg total) by mouth 2 (two) times daily. 14 tablet 0   No current facility-administered medications for this visit.    Allergies:   Patient has no known allergies.   Social History:  The patient  reports that she has never smoked. She has been exposed to tobacco smoke. She has never used smokeless tobacco. She reports that she does not drink alcohol and does not use drugs.   Family History:   family history includes Alcohol abuse in her brother and daughter; Anxiety disorder in her son; COPD in her father and paternal  grandmother; Cancer in her father, mother, and sister; Diabetes in her maternal grandfather; Early death in her brother and sister; Heart disease in her maternal grandmother; Miscarriages / Stillbirths in her daughter.    Review of Systems: Review of Systems  Constitutional: Negative.   HENT: Negative.    Respiratory: Negative.    Cardiovascular:  Positive for chest pain and palpitations.  Gastrointestinal: Negative.   Musculoskeletal: Negative.   Neurological: Negative.   Psychiatric/Behavioral: Negative.    All other systems reviewed and are negative.   PHYSICAL EXAM: VS:  BP (!) 158/80 (BP Location: Right Arm, Patient  Position: Sitting, Cuff Size: Normal)   Pulse 80   Ht 5' 6 (1.676 m)   Wt 138 lb 6 oz (62.8 kg)   SpO2 99%   BMI 22.33 kg/m  , BMI Body mass index is 22.33 kg/m. GEN: Well nourished, well developed, in no acute distress HEENT: normal Neck: no JVD, carotid bruits, or masses Cardiac: RRR; no murmurs, rubs, or gallops,no edema  Respiratory:  clear to auscultation bilaterally, normal work of breathing GI: soft, nontender, nondistended, + BS MS: no deformity or atrophy Skin: warm and dry, no rash Neuro:  Strength and sensation are intact Psych: euthymic mood, full affect  Recent Labs: 08/19/2023: ALT 17; BUN 18; Creatinine, Ser 0.86; Hemoglobin 14.2; Platelets 221.0; Potassium 3.9; Sodium 142; TSH 1.45    Lipid Panel Lab Results  Component Value Date   CHOL 150 08/19/2023   HDL 52.00 08/19/2023   LDLCALC 85 08/19/2023   TRIG 65.0 08/19/2023      Wt Readings from Last 3 Encounters:  04/06/24 138 lb 6 oz (62.8 kg)  04/01/24 134 lb 6 oz (61 kg)  03/11/24 134 lb 8 oz (61 kg)       ASSESSMENT AND PLAN:  Problem List Items Addressed This Visit       Cardiology Problems   Essential hypertension, benign - Primary   Relevant Orders   EKG 12-Lead (Completed)     Other   Palpitations   Relevant Orders   EKG 12-Lead (Completed)   Atypical chest pain Has reasonable exercise tolerance, atypical presentation, fleeting twinges in the chest at rest No ischemic workup needed at this time  Palpitations No significant episodes, likely random ectopy In general asymptomatic  Cardiac risk profile Cholesterol excellent 150, nondiabetic, no smoking history, no strong family history Will hold off on additional cardiac testing but we did discuss echo, monitor, calcium scoring if needed in the future  Signed, Velinda Lunger, M.D., Ph.D. [facility] [place], [place] [phone]

## 2024-04-06 ENCOUNTER — Ambulatory Visit: Attending: Cardiovascular Disease | Admitting: Cardiovascular Disease

## 2024-04-06 ENCOUNTER — Encounter: Payer: Self-pay | Admitting: Cardiovascular Disease

## 2024-04-06 VITALS — BP 158/80 | HR 80 | Ht 66.0 in | Wt 138.4 lb

## 2024-04-06 DIAGNOSIS — I10 Essential (primary) hypertension: Secondary | ICD-10-CM | POA: Diagnosis not present

## 2024-04-06 DIAGNOSIS — R002 Palpitations: Secondary | ICD-10-CM | POA: Diagnosis not present

## 2024-04-06 NOTE — Patient Instructions (Addendum)

## 2024-04-07 DIAGNOSIS — M545 Low back pain, unspecified: Secondary | ICD-10-CM | POA: Diagnosis not present

## 2024-04-13 DIAGNOSIS — M545 Low back pain, unspecified: Secondary | ICD-10-CM | POA: Diagnosis not present

## 2024-07-15 ENCOUNTER — Ambulatory Visit

## 2024-08-19 ENCOUNTER — Encounter
